# Patient Record
Sex: Male | Born: 1980 | Race: White | Hispanic: No | Marital: Single | Smoking: Former smoker
Health system: Southern US, Community
[De-identification: ages and names within clinical notes are randomized; demographics above are authoritative.]

## PROBLEM LIST (undated history)

## (undated) DIAGNOSIS — J189 Pneumonia, unspecified organism: Secondary | ICD-10-CM

## (undated) DIAGNOSIS — F1911 Other psychoactive substance abuse, in remission: Secondary | ICD-10-CM

## (undated) DIAGNOSIS — F419 Anxiety disorder, unspecified: Secondary | ICD-10-CM

---
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
## 2003-02-18 ENCOUNTER — Emergency Department (HOSPITAL_COMMUNITY): Admission: EM | Admit: 2003-02-18 | Discharge: 2003-02-18 | Payer: Self-pay | Admitting: Emergency Medicine

## 2003-03-29 ENCOUNTER — Emergency Department (HOSPITAL_COMMUNITY): Admission: EM | Admit: 2003-03-29 | Discharge: 2003-03-29 | Payer: Self-pay | Admitting: Emergency Medicine

## 2003-03-31 ENCOUNTER — Inpatient Hospital Stay (HOSPITAL_COMMUNITY): Admission: AD | Admit: 2003-03-31 | Discharge: 2003-04-03 | Payer: Self-pay | Admitting: Orthopedic Surgery

## 2003-10-11 ENCOUNTER — Emergency Department (HOSPITAL_COMMUNITY): Admission: EM | Admit: 2003-10-11 | Discharge: 2003-10-11 | Payer: Self-pay | Admitting: Emergency Medicine

## 2003-10-14 ENCOUNTER — Emergency Department (HOSPITAL_COMMUNITY): Admission: EM | Admit: 2003-10-14 | Discharge: 2003-10-14 | Payer: Self-pay | Admitting: Emergency Medicine

## 2003-10-24 ENCOUNTER — Emergency Department (HOSPITAL_COMMUNITY): Admission: EM | Admit: 2003-10-24 | Discharge: 2003-10-24 | Payer: Self-pay | Admitting: Emergency Medicine

## 2004-04-06 ENCOUNTER — Emergency Department (HOSPITAL_COMMUNITY): Admission: EM | Admit: 2004-04-06 | Discharge: 2004-04-06 | Payer: Self-pay | Admitting: Emergency Medicine

## 2004-05-05 ENCOUNTER — Emergency Department (HOSPITAL_COMMUNITY): Admission: EM | Admit: 2004-05-05 | Discharge: 2004-05-05 | Payer: Self-pay | Admitting: Emergency Medicine

## 2008-04-03 ENCOUNTER — Emergency Department (HOSPITAL_COMMUNITY): Admission: EM | Admit: 2008-04-03 | Discharge: 2008-04-03 | Payer: Self-pay | Admitting: Emergency Medicine

## 2008-06-08 ENCOUNTER — Emergency Department (HOSPITAL_COMMUNITY): Admission: EM | Admit: 2008-06-08 | Discharge: 2008-06-08 | Payer: Self-pay | Admitting: *Deleted

## 2010-10-27 NOTE — Discharge Summary (Signed)
NAME:  Stanley Williams, KEELAN                       ACCOUNT NO.:  [id number]   MEDICAL RECORD NO.:  [id number]                   PATIENT TYPE:  INP   LOCATION:  5705                                 FACILITY:  [facility]   PHYSICIAN:  Artist Pais. Mina Marble, M.D.           DATE OF BIRTH:  09-30-1980   DATE OF ADMISSION:  03/31/2003  DATE OF DISCHARGE:  04/03/2003                                 DISCHARGE SUMMARY   ADMISSION DIAGNOSES:  1. Left upper extremity cellulitis.  2. Abscess, left hand and forearm.  3. 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     On current methadone treatment.  4. Tobacco abuse.   DISCHARGE DIAGNOSES:  1. Left upper extremity cellulitis.  2. Abscess, left hand and forearm.  3. 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     On current methadone treatment.  4. Tobacco abuse.   OPERATION/PROCEDURES:  None.   CONSULTATIONS:  Infectious disease.   BRIEF HISTORY:  Patient is a 30 year old white male who sustained pain,  swelling, and redness, status post IV drug injection in the dorsum of his  left hand.  He had increasing pain, swelling, tenderness.  Seen in [facility] room on Monday and was then seen in our office on Tuesday.  He was placed on Augmentin 875 mg b.i.d.  He had worsening symptoms, and it  was felt best to place him in the hospital for IV antibiotics and infectious  disease consultation.   HOSPITAL COURSE:  Patient was admitted on March 31, 2003.  He was placed  on IV Unasyn.  Infectious disease was consulted.  Placed a K pad with moist  heat.  An MRI was obtained to rule out an abscess of the left upper  extremity.  He was placed on pain medication.  Infectious disease felt that  it was best to treat him with Unasyn empirically for his associated  cellulitis, lymphangitis, and thrombophlebitis.   The first day post IV antibiotics, he was improving.  He had decreased  redness,  decreased swelling, decreased pain.  An MRI was obtained.  There  was no discrete abscess or active purulence.   He continued to improved.  He remained afebrile throughout his  hospitalization.  He was ready for discharge home on April 03, 2003 with  the recommendation of p.o. Augmentin, per infectious disease, 875 mg b.i.d.  for two more weeks.  He was then to follow up in our office the following  Tuesday.   MRI report:  As stated above.   WBC was 9.5, hemoglobin 12.2, hematocrit 36.3.  PT 13, PTT 33.  Basic  metabolic panel was within normal limits.  Underwent hepatitis and HIV  testing, which were all negative.  Urinalysis was negative.   DISCHARGE CONDITION:  Stable and improved.   DISCHARGE INSTRUCTIONS:  1. Discharge home.  2. Augmentin 875 mg p.o. b.i.d. x2 weeks.  3. Follow up on Tuesday at Dr. Debbe Bales office.  Contact our     office prior to followup if he has any questions or concerns, worsening     redness, cellulitis, increased pain, or fever greater than 102.      Aura Fey Bobbe Medico.                       Artist Pais Mina Marble, M.D.    SCI/MEDQ  D:  04/21/2003  T:  04/22/2003  Job:  [id number]

## 2011-03-12 LAB — RPR: RPR Ser Ql: NONREACTIVE

## 2014-03-08 ENCOUNTER — Inpatient Hospital Stay (HOSPITAL_COMMUNITY)
Admission: EM | Admit: 2014-03-08 | Discharge: 2014-03-13 | DRG: 177 | Disposition: A | Discharge status: Home / Self Care | Payer: No Typology Code available for payment source | Source: Other Acute Inpatient Hospital | Attending: Surgery | Admitting: Surgery

## 2014-03-08 ENCOUNTER — Encounter (HOSPITAL_COMMUNITY)
Admission: EM | Disposition: A | Discharge status: Home / Self Care | Payer: Self-pay | Source: Other Acute Inpatient Hospital | Attending: Surgery

## 2014-03-08 ENCOUNTER — Inpatient Hospital Stay (HOSPITAL_COMMUNITY): Payer: No Typology Code available for payment source

## 2014-03-08 ENCOUNTER — Inpatient Hospital Stay (HOSPITAL_COMMUNITY): Payer: MEDICAID

## 2014-03-08 ENCOUNTER — Inpatient Hospital Stay (HOSPITAL_COMMUNITY): Payer: No Typology Code available for payment source | Admitting: Certified Registered Nurse Anesthetist

## 2014-03-08 ENCOUNTER — Encounter (HOSPITAL_COMMUNITY): Payer: MEDICAID | Admitting: Certified Registered Nurse Anesthetist

## 2014-03-08 ENCOUNTER — Encounter (HOSPITAL_COMMUNITY): Payer: Self-pay | Admitting: *Deleted

## 2014-03-08 DIAGNOSIS — F1910 Other psychoactive substance abuse, uncomplicated: Secondary | ICD-10-CM | POA: Diagnosis present

## 2014-03-08 DIAGNOSIS — J869 Pyothorax without fistula: Secondary | ICD-10-CM | POA: Diagnosis present

## 2014-03-08 DIAGNOSIS — Z87891 Personal history of nicotine dependence: Secondary | ICD-10-CM

## 2014-03-08 DIAGNOSIS — J189 Pneumonia, unspecified organism: Secondary | ICD-10-CM

## 2014-03-08 DIAGNOSIS — Z79891 Long term (current) use of opiate analgesic: Secondary | ICD-10-CM

## 2014-03-08 DIAGNOSIS — F419 Anxiety disorder, unspecified: Secondary | ICD-10-CM | POA: Diagnosis present

## 2014-03-08 HISTORY — PX: DECORTICATION: SHX5101

## 2014-03-08 HISTORY — DX: Pneumonia, unspecified organism: J18.9

## 2014-03-08 HISTORY — DX: Other psychoactive substance abuse, in remission: F19.11

## 2014-03-08 HISTORY — DX: Anxiety disorder, unspecified: F41.9

## 2014-03-08 HISTORY — PX: THORACOTOMY/LOBECTOMY: SHX6116

## 2014-03-08 LAB — COMPREHENSIVE METABOLIC PANEL
ALBUMIN: 2.9 g/dL — AB (ref 3.5–5.2)
ALK PHOS: 88 U/L (ref 39–117)
ALT: 23 U/L (ref 0–53)
AST: 24 U/L (ref 0–37)
Anion gap: 18 — ABNORMAL HIGH (ref 5–15)
BILIRUBIN TOTAL: 0.4 mg/dL (ref 0.3–1.2)
BUN: 11 mg/dL (ref 6–23)
CHLORIDE: 91 meq/L — AB (ref 96–112)
CO2: 23 mEq/L (ref 19–32)
Calcium: 9.3 mg/dL (ref 8.4–10.5)
Creatinine, Ser: 0.97 mg/dL (ref 0.50–1.35)
GFR calc Af Amer: >90 mL/min (ref >90)
Glucose, Bld: 116 mg/dL — ABNORMAL HIGH (ref 70–99)
POTASSIUM: 4.5 meq/L (ref 3.7–5.3)
Sodium: 132 mEq/L — ABNORMAL LOW (ref 137–147)
Total Protein: 8.3 g/dL (ref 6.0–8.3)

## 2014-03-08 LAB — CBC
HEMATOCRIT: 37.4 % — AB (ref 39.0–52.0)
Hemoglobin: 12.7 g/dL — ABNORMAL LOW (ref 13.0–17.0)
MCH: 29 pg (ref 26.0–34.0)
MCHC: 34 g/dL (ref 30.0–36.0)
MCV: 85.4 fL (ref 78.0–100.0)
PLATELETS: 591 10*3/uL — AB (ref 150–400)
RBC: 4.38 MIL/uL (ref 4.22–5.81)
RDW: 13.6 % (ref 11.5–15.5)
WBC: 21.9 10*3/uL — AB (ref 4.0–10.5)

## 2014-03-08 LAB — MRSA PCR SCREENING: MRSA by PCR: NEGATIVE

## 2014-03-08 LAB — GLUCOSE, CAPILLARY
GLUCOSE-CAPILLARY: 155 mg/dL — AB (ref 70–99)
Glucose-Capillary: 130 mg/dL — ABNORMAL HIGH (ref 70–99)

## 2014-03-08 SURGERY — LOBECTOMY, LUNG, OPEN
Anesthesia: General | Site: Chest | Laterality: Right

## 2014-03-08 MED ORDER — HYDROMORPHONE 0.3 MG/ML IV SOLN
INTRAVENOUS | Status: DC
  Administered 2014-03-08: 2.4 mg via INTRAVENOUS
  Administered 2014-03-08: 3.6 mg via INTRAVENOUS
  Administered 2014-03-08: 16:00:00 via INTRAVENOUS
  Administered 2014-03-09: 1.5 mg via INTRAVENOUS
  Administered 2014-03-09: 2.1 mg via INTRAVENOUS
  Administered 2014-03-09: 0.3 mg via INTRAVENOUS
  Administered 2014-03-09: 4.16 mg via INTRAVENOUS
  Administered 2014-03-09 (×2): via INTRAVENOUS
  Administered 2014-03-09: 1.8 mg via INTRAVENOUS
  Administered 2014-03-09: 4.8 mg via INTRAVENOUS
  Administered 2014-03-10: 2.1 mg via INTRAVENOUS
  Administered 2014-03-10: 2.4 mg via INTRAVENOUS
  Administered 2014-03-10: 3 mg via INTRAVENOUS
  Administered 2014-03-10: 4.5 mg via INTRAVENOUS
  Administered 2014-03-10: 2.4 mg via INTRAVENOUS
  Administered 2014-03-10: 11:00:00 via INTRAVENOUS
  Administered 2014-03-10: 2.4 mg via INTRAVENOUS
  Administered 2014-03-11: 4.2 mg via INTRAVENOUS
  Administered 2014-03-11: 3 mg via INTRAVENOUS
  Administered 2014-03-11: 2.4 mg via INTRAVENOUS
  Administered 2014-03-11: 01:00:00 via INTRAVENOUS
  Administered 2014-03-11: 1.5 mg via INTRAVENOUS
  Administered 2014-03-11 – 2014-03-12 (×2): 1.2 mg via INTRAVENOUS
  Administered 2014-03-12: 0.9 mg via INTRAVENOUS
  Administered 2014-03-12: 06:00:00 via INTRAVENOUS
  Administered 2014-03-12: 1.8 mg via INTRAVENOUS
  Filled 2014-03-08 (×7): qty 25

## 2014-03-08 MED ORDER — INSULIN ASPART 100 UNIT/ML ~~LOC~~ SOLN
0.0000 [IU] | SUBCUTANEOUS | Status: DC
  Administered 2014-03-08 – 2014-03-10 (×6): 2 [IU] via SUBCUTANEOUS

## 2014-03-08 MED ORDER — ROCURONIUM BROMIDE 100 MG/10ML IV SOLN
INTRAVENOUS | Status: DC | PRN
  Administered 2014-03-08: 50 mg via INTRAVENOUS

## 2014-03-08 MED ORDER — PNEUMOCOCCAL VAC POLYVALENT 25 MCG/0.5ML IJ INJ
0.5000 mL | INJECTION | INTRAMUSCULAR | Status: AC | PRN
  Administered 2014-03-13: 0.5 mL via INTRAMUSCULAR
  Filled 2014-03-08: qty 0.5

## 2014-03-08 MED ORDER — VANCOMYCIN HCL IN DEXTROSE 1-5 GM/200ML-% IV SOLN
1000.0000 mg | Freq: Two times a day (BID) | INTRAVENOUS | Status: DC
Start: 2014-03-09 — End: 2014-03-13
  Administered 2014-03-09 – 2014-03-13 (×9): 1000 mg via INTRAVENOUS
  Filled 2014-03-08 (×11): qty 200

## 2014-03-08 MED ORDER — SENNOSIDES-DOCUSATE SODIUM 8.6-50 MG PO TABS
1.0000 | ORAL_TABLET | Freq: Every day | ORAL | Status: DC
  Administered 2014-03-09 – 2014-03-11 (×3): 1 via ORAL
  Filled 2014-03-08 (×6): qty 1

## 2014-03-08 MED ORDER — PHENYLEPHRINE HCL 10 MG/ML IJ SOLN
10.0000 mg | INTRAVENOUS | Status: DC | PRN
  Administered 2014-03-08: 25 ug/min via INTRAVENOUS

## 2014-03-08 MED ORDER — HYDROMORPHONE 0.3 MG/ML IV SOLN
INTRAVENOUS | Status: AC
  Filled 2014-03-08: qty 25

## 2014-03-08 MED ORDER — PIPERACILLIN-TAZOBACTAM 3.375 G IVPB
3.3750 g | Freq: Three times a day (TID) | INTRAVENOUS | Status: DC
  Administered 2014-03-08 – 2014-03-12 (×12): 3.375 g via INTRAVENOUS
  Filled 2014-03-08 (×16): qty 50

## 2014-03-08 MED ORDER — PROPOFOL INFUSION 10 MG/ML OPTIME
INTRAVENOUS | Status: DC | PRN
  Administered 2014-03-08: 100 ug/kg/min via INTRAVENOUS

## 2014-03-08 MED ORDER — DEXTROSE-NACL 5-0.9 % IV SOLN
INTRAVENOUS | Status: DC
  Administered 2014-03-08 – 2014-03-10 (×3): via INTRAVENOUS

## 2014-03-08 MED ORDER — ONDANSETRON HCL 4 MG/2ML IJ SOLN
INTRAMUSCULAR | Status: DC | PRN
  Administered 2014-03-08: 4 mg via INTRAVENOUS

## 2014-03-08 MED ORDER — ACETAMINOPHEN 160 MG/5ML PO SOLN
1000.0000 mg | Freq: Four times a day (QID) | ORAL | Status: DC
  Filled 2014-03-08 (×20): qty 40

## 2014-03-08 MED ORDER — NALOXONE HCL 0.4 MG/ML IJ SOLN
0.4000 mg | INTRAMUSCULAR | Status: DC | PRN
Start: 2014-03-08 — End: 2014-03-12

## 2014-03-08 MED ORDER — DIPHENHYDRAMINE HCL 12.5 MG/5ML PO ELIX
12.5000 mg | ORAL_SOLUTION | Freq: Four times a day (QID) | ORAL | Status: DC | PRN
Start: 2014-03-08 — End: 2014-03-12
  Filled 2014-03-08: qty 5

## 2014-03-08 MED ORDER — ONDANSETRON HCL 4 MG/2ML IJ SOLN: 4.0000 mg | Freq: Four times a day (QID) | INTRAMUSCULAR | Status: DC | PRN

## 2014-03-08 MED ORDER — ENOXAPARIN SODIUM 40 MG/0.4ML ~~LOC~~ SOLN
40.0000 mg | Freq: Every day | SUBCUTANEOUS | Status: DC
  Administered 2014-03-09 – 2014-03-13 (×5): 40 mg via SUBCUTANEOUS
  Filled 2014-03-08 (×5): qty 0.4

## 2014-03-08 MED ORDER — FENTANYL CITRATE 0.05 MG/ML IJ SOLN
INTRAMUSCULAR | Status: AC
  Filled 2014-03-08: qty 5

## 2014-03-08 MED ORDER — METHADONE HCL 10 MG PO TABS: 180.0000 mg | ORAL_TABLET | ORAL | Status: DC

## 2014-03-08 MED ORDER — LIDOCAINE HCL (CARDIAC) 20 MG/ML IV SOLN
INTRAVENOUS | Status: AC
  Filled 2014-03-08: qty 5

## 2014-03-08 MED ORDER — LACTATED RINGERS IV SOLN: INTRAVENOUS | Status: DC

## 2014-03-08 MED ORDER — DIPHENHYDRAMINE HCL 50 MG/ML IJ SOLN
12.5000 mg | Freq: Four times a day (QID) | INTRAMUSCULAR | Status: DC | PRN
  Administered 2014-03-09 – 2014-03-12 (×3): 12.5 mg via INTRAVENOUS
  Filled 2014-03-08 (×3): qty 1

## 2014-03-08 MED ORDER — LACTATED RINGERS IV SOLN
INTRAVENOUS | Status: DC | PRN
  Administered 2014-03-08: 13:00:00 via INTRAVENOUS

## 2014-03-08 MED ORDER — FENTANYL CITRATE 0.05 MG/ML IJ SOLN
INTRAMUSCULAR | Status: AC
  Filled 2014-03-08: qty 2

## 2014-03-08 MED ORDER — FENTANYL CITRATE 0.05 MG/ML IJ SOLN
25.0000 ug | INTRAMUSCULAR | Status: DC | PRN
  Administered 2014-03-08 (×2): 50 ug via INTRAVENOUS

## 2014-03-08 MED ORDER — LEVALBUTEROL HCL 0.63 MG/3ML IN NEBU
0.6300 mg | INHALATION_SOLUTION | Freq: Four times a day (QID) | RESPIRATORY_TRACT | Status: DC
  Administered 2014-03-08 – 2014-03-10 (×8): 0.63 mg via RESPIRATORY_TRACT
  Filled 2014-03-08 (×16): qty 3

## 2014-03-08 MED ORDER — PROPOFOL 10 MG/ML IV BOLUS
INTRAVENOUS | Status: DC | PRN
  Administered 2014-03-08: 200 mg via INTRAVENOUS

## 2014-03-08 MED ORDER — OXYCODONE HCL 5 MG PO TABS
10.0000 mg | ORAL_TABLET | ORAL | Status: DC | PRN
  Administered 2014-03-09 – 2014-03-13 (×9): 10 mg via ORAL
  Filled 2014-03-08 (×9): qty 2

## 2014-03-08 MED ORDER — ONDANSETRON HCL 4 MG/2ML IJ SOLN
INTRAMUSCULAR | Status: AC
  Filled 2014-03-08: qty 2

## 2014-03-08 MED ORDER — PROPOFOL 10 MG/ML IV BOLUS
INTRAVENOUS | Status: AC
  Filled 2014-03-08: qty 20

## 2014-03-08 MED ORDER — 0.9 % SODIUM CHLORIDE (POUR BTL) OPTIME
TOPICAL | Status: DC | PRN
  Administered 2014-03-08 (×2): 1000 mL

## 2014-03-08 MED ORDER — MIDAZOLAM HCL 5 MG/5ML IJ SOLN
INTRAMUSCULAR | Status: DC | PRN
  Administered 2014-03-08: 2 mg via INTRAVENOUS

## 2014-03-08 MED ORDER — ACETAMINOPHEN 500 MG PO TABS
1000.0000 mg | ORAL_TABLET | Freq: Four times a day (QID) | ORAL | Status: DC
  Administered 2014-03-08 – 2014-03-13 (×17): 1000 mg via ORAL
  Filled 2014-03-08 (×21): qty 2

## 2014-03-08 MED ORDER — LACTATED RINGERS IV SOLN
INTRAVENOUS | Status: DC | PRN
  Administered 2014-03-08 (×2): via INTRAVENOUS

## 2014-03-08 MED ORDER — VANCOMYCIN HCL IN DEXTROSE 1-5 GM/200ML-% IV SOLN
1000.0000 mg | Freq: Three times a day (TID) | INTRAVENOUS | Status: DC
  Administered 2014-03-08 (×2): 1000 mg via INTRAVENOUS
  Filled 2014-03-08 (×3): qty 200

## 2014-03-08 MED ORDER — NEOSTIGMINE METHYLSULFATE 10 MG/10ML IV SOLN
INTRAVENOUS | Status: DC | PRN
  Administered 2014-03-08: 4 mg via INTRAVENOUS

## 2014-03-08 MED ORDER — SODIUM CHLORIDE 0.9 % IJ SOLN: 3.0000 mL | Freq: Two times a day (BID) | INTRAMUSCULAR | Status: DC

## 2014-03-08 MED ORDER — PIPERACILLIN-TAZOBACTAM 3.375 G IVPB
3.3750 g | Freq: Three times a day (TID) | INTRAVENOUS | Status: DC
  Administered 2014-03-08: 3.375 g via INTRAVENOUS
  Filled 2014-03-08 (×3): qty 50

## 2014-03-08 MED ORDER — ARTIFICIAL TEARS OP OINT
TOPICAL_OINTMENT | OPHTHALMIC | Status: AC
  Filled 2014-03-08: qty 3.5

## 2014-03-08 MED ORDER — GLYCOPYRROLATE 0.2 MG/ML IJ SOLN
INTRAMUSCULAR | Status: DC | PRN
  Administered 2014-03-08: .6 mg via INTRAVENOUS

## 2014-03-08 MED ORDER — LIDOCAINE HCL (CARDIAC) 20 MG/ML IV SOLN
INTRAVENOUS | Status: DC | PRN
  Administered 2014-03-08: 20 mg via INTRAVENOUS

## 2014-03-08 MED ORDER — METHADONE HCL 5 MG PO TABS: 180.0000 mg | ORAL_TABLET | Freq: Every day | ORAL | Status: DC

## 2014-03-08 MED ORDER — METHADONE HCL 10 MG PO TABS
180.0000 mg | ORAL_TABLET | Freq: Once | ORAL | Status: DC
  Administered 2014-03-08: 180 mg via ORAL
  Filled 2014-03-08: qty 36
  Filled 2014-03-08: qty 18

## 2014-03-08 MED ORDER — MIDAZOLAM HCL 2 MG/2ML IJ SOLN
INTRAMUSCULAR | Status: AC
  Filled 2014-03-08: qty 2

## 2014-03-08 MED ORDER — FENTANYL CITRATE 0.05 MG/ML IJ SOLN
100.0000 ug | Freq: Once | INTRAMUSCULAR | Status: AC
  Administered 2014-03-08: 100 ug via INTRAVENOUS

## 2014-03-08 MED ORDER — INFLUENZA VAC SPLIT QUAD 0.5 ML IM SUSY: 0.5000 mL | PREFILLED_SYRINGE | INTRAMUSCULAR | Status: DC | PRN

## 2014-03-08 MED ORDER — SODIUM CHLORIDE 0.9 % IJ SOLN: 9.0000 mL | INTRAMUSCULAR | Status: DC | PRN

## 2014-03-08 MED ORDER — VANCOMYCIN HCL IN DEXTROSE 1-5 GM/200ML-% IV SOLN: 1000.0000 mg | Freq: Two times a day (BID) | INTRAVENOUS | Status: DC

## 2014-03-08 MED ORDER — MIDAZOLAM HCL 2 MG/2ML IJ SOLN: 2.0000 mg | Freq: Once | INTRAMUSCULAR | Status: DC

## 2014-03-08 MED ORDER — BISACODYL 5 MG PO TBEC
10.0000 mg | DELAYED_RELEASE_TABLET | Freq: Every day | ORAL | Status: DC
  Administered 2014-03-08 – 2014-03-11 (×4): 10 mg via ORAL
  Filled 2014-03-08 (×4): qty 2

## 2014-03-08 MED ORDER — FENTANYL CITRATE 0.05 MG/ML IJ SOLN
INTRAMUSCULAR | Status: DC | PRN
  Administered 2014-03-08: 50 ug via INTRAVENOUS
  Administered 2014-03-08: 250 ug via INTRAVENOUS
  Administered 2014-03-08: 50 ug via INTRAVENOUS

## 2014-03-08 MED ORDER — DEXTROSE-NACL 5-0.9 % IV SOLN
INTRAVENOUS | Status: DC
  Administered 2014-03-08: 11:00:00 via INTRAVENOUS

## 2014-03-08 MED ORDER — ROCURONIUM BROMIDE 50 MG/5ML IV SOLN
INTRAVENOUS | Status: AC
Start: 2014-03-08 — End: 2014-03-08
  Filled 2014-03-08: qty 1

## 2014-03-08 MED ORDER — METHADONE HCL 10 MG/ML PO CONC: 180.0000 mg | Freq: Every day | ORAL | Status: DC

## 2014-03-08 MED ORDER — MIDAZOLAM HCL 2 MG/2ML IJ SOLN
INTRAMUSCULAR | Status: AC
  Administered 2014-03-08: 2 mg
  Filled 2014-03-08: qty 2

## 2014-03-08 MED ORDER — POTASSIUM CHLORIDE 10 MEQ/50ML IV SOLN
10.0000 meq | Freq: Every day | INTRAVENOUS | Status: DC | PRN
  Filled 2014-03-08: qty 50

## 2014-03-08 MED ORDER — ONDANSETRON HCL 4 MG/2ML IJ SOLN
4.0000 mg | Freq: Four times a day (QID) | INTRAMUSCULAR | Status: DC | PRN
Start: 2014-03-08 — End: 2014-03-08

## 2014-03-08 SURGICAL SUPPLY — 66 items
ADH SKN CLS APL DERMABOND .7 (GAUZE/BANDAGES/DRESSINGS) ×2
BLADE SURG 11 STRL SS (BLADE) IMPLANT
CANISTER SUCTION 2500CC (MISCELLANEOUS) ×4 IMPLANT
CATH KIT ON Q 5IN SLV (PAIN MANAGEMENT) IMPLANT
CATH THORACIC 28FR (CATHETERS) IMPLANT
CATH THORACIC 36FR (CATHETERS) IMPLANT
CATH THORACIC 36FR RT ANG (CATHETERS) IMPLANT
CLIP TI MEDIUM 24 (CLIP) ×2 IMPLANT
CONN ST 1/4X3/8  BEN (MISCELLANEOUS) ×3
CONN ST 1/4X3/8 BEN (MISCELLANEOUS) IMPLANT
CONN Y 3/8X3/8X3/8  BEN (MISCELLANEOUS) ×2
CONN Y 3/8X3/8X3/8 BEN (MISCELLANEOUS) IMPLANT
CONT SPEC 4OZ CLIKSEAL STRL BL (MISCELLANEOUS) ×4 IMPLANT
COVER SURGICAL LIGHT HANDLE (MISCELLANEOUS) ×4 IMPLANT
DERMABOND ADVANCED (GAUZE/BANDAGES/DRESSINGS) ×2
DERMABOND ADVANCED .7 DNX12 (GAUZE/BANDAGES/DRESSINGS) IMPLANT
DRAIN CHANNEL 32F RND 10.7 FF (WOUND CARE) ×3 IMPLANT
DRAPE LAPAROSCOPIC ABDOMINAL (DRAPES) ×2 IMPLANT
DRAPE WARM FLUID 44X44 (DRAPE) ×2 IMPLANT
ELECT REM PT RETURN 9FT ADLT (ELECTROSURGICAL) ×2
ELECTRODE REM PT RTRN 9FT ADLT (ELECTROSURGICAL) ×1 IMPLANT
GAUZE SPONGE 4X4 12PLY STRL (GAUZE/BANDAGES/DRESSINGS) ×2 IMPLANT
GLOVE EUDERMIC 7 POWDERFREE (GLOVE) ×4 IMPLANT
GOWN STRL REUS W/ TWL LRG LVL3 (GOWN DISPOSABLE) ×2 IMPLANT
GOWN STRL REUS W/ TWL XL LVL3 (GOWN DISPOSABLE) ×1 IMPLANT
GOWN STRL REUS W/TWL LRG LVL3 (GOWN DISPOSABLE) ×2
GOWN STRL REUS W/TWL XL LVL3 (GOWN DISPOSABLE) ×8
KIT BASIN OR (CUSTOM PROCEDURE TRAY) ×2 IMPLANT
KIT ROOM TURNOVER OR (KITS) ×2 IMPLANT
NS IRRIG 1000ML POUR BTL (IV SOLUTION) ×7 IMPLANT
PACK CHEST (CUSTOM PROCEDURE TRAY) ×2 IMPLANT
PAD ARMBOARD 7.5X6 YLW CONV (MISCELLANEOUS) ×4 IMPLANT
SEALANT SURG COSEAL 4ML (VASCULAR PRODUCTS) IMPLANT
SEALANT SURG COSEAL 8ML (VASCULAR PRODUCTS) IMPLANT
SOLUTION ANTI FOG 6CC (MISCELLANEOUS) ×2 IMPLANT
SPECIMEN JAR MEDIUM (MISCELLANEOUS) ×3 IMPLANT
SPONGE GAUZE 4X4 12PLY STER LF (GAUZE/BANDAGES/DRESSINGS) ×1 IMPLANT
SUT PROLENE 3 0 SH DA (SUTURE) IMPLANT
SUT PROLENE 4 0 RB 1 (SUTURE)
SUT PROLENE 4-0 RB1 .5 CRCL 36 (SUTURE) IMPLANT
SUT SILK  1 MH (SUTURE) ×3
SUT SILK 1 MH (SUTURE) ×2 IMPLANT
SUT SILK 2 0SH CR/8 30 (SUTURE) ×1 IMPLANT
SUT SILK 3 0 SH CR/8 (SUTURE) IMPLANT
SUT VIC AB 1 CTX 36 (SUTURE) ×4
SUT VIC AB 1 CTX36XBRD ANBCTR (SUTURE) ×2 IMPLANT
SUT VIC AB 2 TP1 27 (SUTURE) ×3 IMPLANT
SUT VIC AB 2-0 CT1 27 (SUTURE)
SUT VIC AB 2-0 CT1 TAPERPNT 27 (SUTURE) IMPLANT
SUT VIC AB 2-0 CTX 36 (SUTURE) ×4 IMPLANT
SUT VIC AB 2-0 UR6 27 (SUTURE) IMPLANT
SUT VIC AB 3-0 MH 27 (SUTURE) IMPLANT
SUT VIC AB 3-0 SH 27 (SUTURE)
SUT VIC AB 3-0 SH 27X BRD (SUTURE) IMPLANT
SUT VIC AB 3-0 X1 27 (SUTURE) ×3 IMPLANT
SWAB COLLECTION DEVICE MRSA (MISCELLANEOUS) ×1 IMPLANT
SYSTEM SAHARA CHEST DRAIN ATS (WOUND CARE) ×2 IMPLANT
TAPE CLOTH SURG 4X10 WHT LF (GAUZE/BANDAGES/DRESSINGS) ×1 IMPLANT
TAPE UMBILICAL 1/8 X36 TWILL (MISCELLANEOUS) IMPLANT
TIP APPLICATOR SPRAY EXTEND 16 (VASCULAR PRODUCTS) IMPLANT
TOWEL OR 17X24 6PK STRL BLUE (TOWEL DISPOSABLE) ×2 IMPLANT
TOWEL OR 17X26 10 PK STRL BLUE (TOWEL DISPOSABLE) ×2 IMPLANT
TRAP SPECIMEN MUCOUS 40CC (MISCELLANEOUS) IMPLANT
TRAY FOLEY CATH 16FRSI W/METER (SET/KITS/TRAYS/PACK) ×2 IMPLANT
TUBE ANAEROBIC SPECIMEN COL (MISCELLANEOUS) ×1 IMPLANT
WATER STERILE IRR 1000ML POUR (IV SOLUTION) ×4 IMPLANT

## 2014-03-08 NOTE — H&P (Signed)
[address]       Stanley Williams [address]             [phone]      Cardiothoracic Surgery History and Physical   Stanley Williams is an 33 y.o. male.   Chief Complaint:  Right Empyema HPI:   The patient is a 33 year old gentleman with a long history of IVDA on daily Methadone, smoking until recently, who presented to [facility] ER about a week ago with shortness of breath and cough. He was diagnosed with pneumonia and treated with amoxicillin and another antibiotic. He says he took all of the antibiotic but did not get better and went back to [facility] ER last pm with worsening shortness of breath, cough, fever, right chest pain. Chest CT showed a large right empyema. I was called by the [facility] ER physician last pm and planned to transfer the patient last night but there were no ICU or stepdown beds so he was transferred this am.  Past Medical History  Diagnosis Date  . Anxiety   . 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 PNA (pneumonia) 03/08/2014    Past Surgical History  Procedure Laterality Date  . Ankle Left 1997  Infection in left hand and forearm due to IVDA requiring drainage 03/2003  History reviewed. No pertinent family history. Social History:  reports that he has quit smoking. His smoking use included E-cigarettes. He smoked 0.00 packs per day. He has never used smokeless tobacco. He reports that he used IV drugs in the past but has been on Methadone for 13 years. He denies current IVDA.  He reports that he does not drink alcohol.  Allergies:  Allergies  Allergen Reactions  . Toradol [Ketorolac Tromethamine] Nausea Only  . Ultram [Tramadol] Nausea Only    Medications Prior to Admission  Medication Sig Dispense Refill  . methadone (DOLOPHINE) 10 MG/ML solution Take 180 mg by mouth daily.        No results found for this or any previous visit (from the past 48 hour(s)). No results found.  Review of Systems  Constitutional:  Positive for fever, chills, malaise/fatigue and diaphoresis. Negative for weight loss.  HENT: Negative.   Eyes: Negative.   Respiratory: Positive for cough, sputum production and shortness of breath. Negative for hemoptysis.   Cardiovascular: Positive for chest pain and orthopnea. Negative for palpitations, leg swelling and PND.  Gastrointestinal: Negative.   Genitourinary: Negative.   Musculoskeletal: Negative.   Skin: Negative.   Neurological: Negative.   Endo/Heme/Allergies: Negative.   Psychiatric/Behavioral: Positive for substance abuse. The patient is nervous/anxious.     Blood pressure 128/69, pulse 107, temperature 98 F (36.7 C), temperature source Oral, resp. rate 23, height  (1.854 m), weight 84.1 kg (185 lb 6.5 oz), SpO2 95.00%. Physical Exam  Constitutional: He is oriented to person, place, and time. He appears well-developed and well-nourished.  Looks uncomfortable but in no distress.  HENT:  Head: Normocephalic and atraumatic.  Mouth/Throat: Oropharynx is clear and moist.  Eyes: EOM are normal. Pupils are equal, round, and reactive to light.  Large pupils bilat  Neck: Normal range of motion. Neck supple. No JVD present. No tracheal deviation present. No thyromegaly present.  Cardiovascular: Normal rate, regular rhythm, normal heart sounds and intact distal pulses.   No murmur heard. Respiratory: He exhibits no tenderness.  Decreased breath sounds on the right with rhonchi.  GI: Soft. Bowel  sounds are normal. He exhibits no distension and no mass. There is no tenderness.  Musculoskeletal: He exhibits no edema.  Lymphadenopathy:    He has no cervical adenopathy.  Neurological: He is alert and oriented to person, place, and time. He has normal strength. No cranial nerve deficit or sensory deficit.  Skin: Skin is warm and dry.  Multiple tattoos on torso and upper extremities.  Psychiatric: He has a normal mood and affect.     Assessment/Plan  His CT from  [facility] was reviewed with radiology. There is a large complex right empyema that will require drainage and decortication of the lung. Plan to do that this afternoon. I discussed the procedure of right thoracotomy, drainage of empyema and decortication of the lung with the patient including alternatives, benefits, and risks including but not limited to bleeding, infection, prolonged air leak, respiratory failure, and he understands and agrees to proceed.  Marchia Diguglielmo K 03/08/2014, 10:13 AM

## 2014-03-08 NOTE — Brief Op Note (Signed)
03/08/2014  3:00 PM  PATIENT:  Stanley Williams  33 y.o. male  PRE-OPERATIVE DIAGNOSIS:  Right Empyema  POST-OPERATIVE DIAGNOSIS:  Right Empyema  PROCEDURE:  Procedure(s): THORACOTOMY  (Right) DECORTICATION, DRAINAGE OF EMPYEMA (Right)  SURGEON:  Surgeon(s) and Role:    * Alleen Borne, MD - Primary  PHYSICIAN ASSISTANT: Coral Ceo, PA-C    ANESTHESIA:   general  EBL:  Total I/O In: 1200 [I.V.:1000; IV Piggyback:200] Out: 340 [Urine:340]  BLOOD ADMINISTERED:none  DRAINS: (3 53F) Blake drain(s) in the right pleural space   LOCAL MEDICATIONS USED:  NONE  SPECIMEN:  Source of Specimen:  culture of right empyema  DISPOSITION OF SPECIMEN:  micro  COUNTS:  YES  TOURNIQUET:  * No tourniquets in log *  DICTATION: .Note written in EPIC  PLAN OF CARE: Admit to inpatient   PATIENT DISPOSITION:  PACU - hemodynamically stable.   Delay start of Pharmacological VTE agent (>24hrs) due to surgical blood loss or risk of bleeding: no

## 2014-03-08 NOTE — Anesthesia Postprocedure Evaluation (Signed)
  Anesthesia Post-op Note  Patient: Stanley Williams  Procedure(s) Performed: Procedure(s): THORACOTOMY  (Right) DECORTICATION, DRAINAGE OF EMPYEMA (Right)  Patient Location: PACU  Anesthesia Type:General  Level of Consciousness: awake  Airway and Oxygen Therapy: Patient Spontanous Breathing  Post-op Pain: mild  Post-op Assessment: Post-op Vital signs reviewed  Post-op Vital Signs: Reviewed  Last Vitals:  Filed Vitals:   03/08/14 1535  BP: 138/99  Pulse:   Temp: 36.2 C  Resp: 23    Complications: No apparent anesthesia complications

## 2014-03-08 NOTE — Transfer of Care (Signed)
Immediate Anesthesia Transfer of Care Note  Patient: Stanley Williams  Procedure(s) Performed: Procedure(s): THORACOTOMY  (Right) DECORTICATION, DRAINAGE OF EMPYEMA (Right)  Patient Location: PACU  Anesthesia Type:General  Level of Consciousness: awake and alert   Airway & Oxygen Therapy: Patient Spontanous Breathing and Patient connected to face mask oxygen  Post-op Assessment: Report given to PACU RN, Post -op Vital signs reviewed and stable and Patient moving all extremities  Post vital signs: Reviewed and stable  Complications: No apparent anesthesia complications

## 2014-03-08 NOTE — Anesthesia Procedure Notes (Addendum)
Procedure Name: Intubation Date/Time: 03/08/2014 1:20 PM Performed by: Orvilla Fus A Pre-anesthesia Checklist: Patient identified, Timeout performed, Emergency Drugs available, Suction available and Patient being monitored Patient Re-evaluated:Patient Re-evaluated prior to inductionOxygen Delivery Method: Circle system utilized Preoxygenation: Pre-oxygenation with 100% oxygen Intubation Type: IV induction Ventilation: Mask ventilation without difficulty Laryngoscope Size: Mac and 3 Grade View: Grade I Tube type: Oral Endobronchial tube: Double lumen EBT and Left and 39 Fr Number of attempts: 1 Airway Equipment and Method: Rigid stylet Placement Confirmation: ETT inserted through vocal cords under direct vision,  breath sounds checked- equal and bilateral and positive ETCO2 Secured at: 29 cm Tube secured with: Tape Dental Injury: Teeth and Oropharynx as per pre-operative assessment    RIJ CVP Dual lumen: 1235-1250:The patient was identified and consent obtained.  TO was performed, and full barrier precautions were used.  The skin was anesthetized with lidocaine.  Once the vein was located with the 22 ga. needle using ultrasound guidance , the wire was inserted into the vein.  The wire location was confirmed with ultrasound.  The tissue was dilated and the catheter was carefully inserted, then sutured in place. A dressing was applied. The patient tolerated the procedure well.   CE

## 2014-03-08 NOTE — Anesthesia Preprocedure Evaluation (Signed)
Anesthesia Evaluation  Patient identified by MRN, date of birth, ID band Patient awake    Reviewed: Allergy & Precautions, H&P , NPO status , Patient's Chart, lab work & pertinent test results  Airway Mallampati: II      Dental   Pulmonary pneumonia -, former smoker,          Cardiovascular     Neuro/Psych    GI/Hepatic negative GI ROS, Neg liver ROS,   Endo/Other  negative endocrine ROS  Renal/GU negative Renal ROS     Musculoskeletal   Abdominal   Peds  Hematology   Anesthesia Other Findings   Reproductive/Obstetrics                           Anesthesia Physical Anesthesia Plan  ASA: III  Anesthesia Plan: General   Post-op Pain Management:    Induction: Intravenous  Airway Management Planned: Double Lumen EBT  Additional Equipment: CVP and Arterial line  Intra-op Plan:   Post-operative Plan: Possible Post-op intubation/ventilation  Informed Consent: I have reviewed the patients History and Physical, chart, labs and discussed the procedure including the risks, benefits and alternatives for the proposed anesthesia with the patient or authorized representative who has indicated his/her understanding and acceptance.   Dental advisory given  Plan Discussed with: CRNA and Anesthesiologist  Anesthesia Plan Comments:         Anesthesia Quick Evaluation

## 2014-03-08 NOTE — Progress Notes (Signed)
ANTIBIOTIC CONSULT NOTE - INITIAL  Pharmacy Consult for vancomycin and zosyn Indication:  R empyema  Allergies  Allergen Reactions  . Toradol [Ketorolac Tromethamine] Nausea Only  . Ultram [Tramadol] Nausea Only    Patient Measurements: Height:  (185.4 cm) Weight: 185 lb 6.5 oz (84.1 kg) IBW/kg (Calculated) : 79.9   Vital Signs: Temp: 98 F (36.7 C) (09/28 1610) Temp src: Oral (09/28 0937) BP: 128/69 mmHg (09/28 1000) Pulse Rate: 107 (09/28 1000) Intake/Output from previous day:   Intake/Output from this shift:    Labs: No results found for this basename: WBC, HGB, PLT, LABCREA, CREATININE,  in the last 72 hours CrCl is unknown because no creatinine reading has been taken. No results found for this basename: VANCOTROUGH, VANCOPEAK, VANCORANDOM, GENTTROUGH, GENTPEAK, GENTRANDOM, TOBRATROUGH, TOBRAPEAK, TOBRARND, AMIKACINPEAK, AMIKACINTROU, AMIKACIN,  in the last 72 hours   Microbiology: No results found for this or any previous visit (from the past 720 hour(s)).  Medical History: Past Medical History  Diagnosis Date  . Anxiety   . 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 PNA (pneumonia) 03/08/2014   Assessment: The patient is a 33 year old gentleman with a long history of IVDA on daily Methadone, smoking until recently, who presented to [facility] ER about a week ago with shortness of breath and cough. He was diagnosed with pneumonia and treated with amoxicillin and azithromycin. He says he took all of the antibiotic but did not get better and went back to [facility] ER last pm with worsening shortness of breath, cough, fever, right chest pain. Chest CT showed a large right empyema.  Transferred from [place] to [facility]. Pharmacy consulted to dose vancomycin and zosyn for R empyema.  Wt 84 kg, labs not yet drawn. On amoxicillin and azithromycin at home.  ? abx at [facility]?  Goal of Therapy:  Vancomycin trough level 15-20 mcg/ml  Plan:  - Zosyn 3.375 gm IV q8h,  infuse each dose over 4 hours - Vancomycin 1000 mg IV q8h -f/u renal fxn, wbc, temp, culture data -steady-state vanc trough as needed  Herby Abraham, Pharm.D. [phone] 03/08/2014 11:19 AM

## 2014-03-09 ENCOUNTER — Inpatient Hospital Stay (HOSPITAL_COMMUNITY): Payer: No Typology Code available for payment source

## 2014-03-09 LAB — BLOOD GAS, ARTERIAL
ACID-BASE EXCESS: 1 mmol/L (ref 0.0–2.0)
BICARBONATE: 25.5 meq/L — AB (ref 20.0–24.0)
Drawn by: 347621
O2 Content: 3 L/min
O2 SAT: 94.4 %
PCO2 ART: 43.6 mmHg (ref 35.0–45.0)
PO2 ART: 77.1 mmHg — AB (ref 80.0–100.0)
Patient temperature: 98.7
TCO2: 26.8 mmol/L (ref 0–100)
pH, Arterial: 7.384 (ref 7.350–7.450)

## 2014-03-09 LAB — GLUCOSE, CAPILLARY
GLUCOSE-CAPILLARY: 148 mg/dL — AB (ref 70–99)
GLUCOSE-CAPILLARY: 141 mg/dL — AB (ref 70–99)
GLUCOSE-CAPILLARY: 126 mg/dL — AB (ref 70–99)
GLUCOSE-CAPILLARY: 113 mg/dL — AB (ref 70–99)
Glucose-Capillary: 118 mg/dL — ABNORMAL HIGH (ref 70–99)
Glucose-Capillary: 114 mg/dL — ABNORMAL HIGH (ref 70–99)

## 2014-03-09 LAB — CBC
HEMATOCRIT: 34.8 % — AB (ref 39.0–52.0)
HEMOGLOBIN: 11.7 g/dL — AB (ref 13.0–17.0)
MCH: 29 pg (ref 26.0–34.0)
MCHC: 33.6 g/dL (ref 30.0–36.0)
MCV: 86.4 fL (ref 78.0–100.0)
Platelets: 593 10*3/uL — ABNORMAL HIGH (ref 150–400)
RBC: 4.03 MIL/uL — ABNORMAL LOW (ref 4.22–5.81)
RDW: 13.8 % (ref 11.5–15.5)
WBC: 16 10*3/uL — ABNORMAL HIGH (ref 4.0–10.5)

## 2014-03-09 LAB — BASIC METABOLIC PANEL
Anion gap: 12 (ref 5–15)
BUN: 9 mg/dL (ref 6–23)
CHLORIDE: 100 meq/L (ref 96–112)
CO2: 27 mEq/L (ref 19–32)
Calcium: 8.2 mg/dL — ABNORMAL LOW (ref 8.4–10.5)
Creatinine, Ser: 0.94 mg/dL (ref 0.50–1.35)
GFR calc Af Amer: >90 mL/min (ref >90)
GFR calc non Af Amer: >90 mL/min (ref >90)
GLUCOSE: 147 mg/dL — AB (ref 70–99)
POTASSIUM: 4.4 meq/L (ref 3.7–5.3)
SODIUM: 139 meq/L (ref 137–147)

## 2014-03-09 MED ORDER — PANTOPRAZOLE SODIUM 40 MG PO TBEC
40.0000 mg | DELAYED_RELEASE_TABLET | Freq: Every day | ORAL | Status: DC
  Administered 2014-03-09 – 2014-03-13 (×5): 40 mg via ORAL
  Filled 2014-03-09 (×5): qty 1

## 2014-03-09 MED ORDER — KETOROLAC TROMETHAMINE 15 MG/ML IJ SOLN
15.0000 mg | Freq: Four times a day (QID) | INTRAMUSCULAR | Status: DC | PRN
  Filled 2014-03-09: qty 1

## 2014-03-09 MED ORDER — METHADONE HCL 10 MG PO TABS
180.0000 mg | ORAL_TABLET | Freq: Once | ORAL | Status: AC
  Administered 2014-03-09: 180 mg via ORAL

## 2014-03-09 MED ORDER — METHADONE HCL 10 MG PO TABS
180.0000 mg | ORAL_TABLET | ORAL | Status: DC
  Administered 2014-03-10 – 2014-03-13 (×4): 180 mg via ORAL
  Filled 2014-03-09 (×5): qty 18

## 2014-03-09 MED ORDER — PROPOFOL 10 MG/ML IV BOLUS
INTRAVENOUS | Status: AC
  Filled 2014-03-09: qty 20

## 2014-03-09 NOTE — Progress Notes (Signed)
1 Day Post-Op Procedure(s) (LRB): THORACOTOMY  (Right) DECORTICATION, DRAINAGE OF EMPYEMA (Right) Subjective:  Complains of pain but has been up walking and out of bed  Objective: Vital signs in last 24 hours: Temp:  [97.2 F (36.2 C)-98.9 F (37.2 C)] 97.8 F (36.6 C) (09/29 0328) Pulse Rate:  [70-113] 76 (09/29 0620) Cardiac Rhythm:  [-] Normal sinus rhythm (09/28 2000) Resp:  [9-30] 18 (09/29 0620) BP: (114-147)/(53-99) 129/77 mmHg (09/29 0620) SpO2:  [90 %-100 %] 94 % (09/29 0620) Arterial Line BP: (116-174)/(62-88) 149/73 mmHg (09/29 0620) Weight:  [84.1 kg (185 lb 6.5 oz)] 84.1 kg (185 lb 6.5 oz) (09/28 0937)  Hemodynamic parameters for last 24 hours:    Intake/Output from previous day: 09/28 0701 - 09/29 0700 In: 5259.6 [P.O.:1020; I.V.:4039.6; IV Piggyback:200] Out: 3155 [Urine:2715; Chest Tube:440] Intake/Output this shift:    General appearance: alert and cooperative Heart: regular rate and rhythm, S1, S2 normal, no murmur, click, rub or gallop Lungs: diminished breath sounds RLL serosanguinous chest tube output, no air leak  Lab Results:  Recent Labs  03/08/14 1110 03/09/14 0508  WBC 21.9* 16.0*  HGB 12.7* 11.7*  HCT 37.4* 34.8*  PLT 591* 593*   BMET:  Recent Labs  03/08/14 1110 03/09/14 0508  NA 132* 139  K 4.5 4.4  CL 91* 100  CO2 23 27  GLUCOSE 116* 147*  BUN 11 9  CREATININE 0.97 0.94  CALCIUM 9.3 8.2*    PT/INR: No results found for this basename: LABPROT, INR,  in the last 72 hours ABG    Component Value Date/Time   PHART 7.384 03/09/2014 0500   HCO3 25.5* 03/09/2014 0500   TCO2 26.8 03/09/2014 0500   O2SAT 94.4 03/09/2014 0500   CBG (last 3)   Recent Labs  03/08/14 1945 03/08/14 2338 03/09/14 0326  GLUCAP 155* 130* 148*   CXR ok  Assessment/Plan: S/P Procedure(s) (LRB): THORACOTOMY  (Right) DECORTICATION, DRAINAGE OF EMPYEMA (Right) He is doing well Pain control is the biggest issue for him. Will add Toradol. He says  po Toradol has caused nausea in the past but he is willing to try IV Toradol. Continue dilaudid PCA and Methadone.  DC foley and A-line Mobilize and do IS Continue vanc and Zosyn pending cultures. Lovenox and SCD's for DVT prophylaxis   LOS: 1 day    BARTLE,BRYAN K 03/09/2014

## 2014-03-09 NOTE — Progress Notes (Signed)
UR completed.  Joseline Mccampbell, RN BSN [facility] CCM Trauma/Neuro ICU Case Manager [phone]  

## 2014-03-10 ENCOUNTER — Inpatient Hospital Stay (HOSPITAL_COMMUNITY): Payer: No Typology Code available for payment source

## 2014-03-10 ENCOUNTER — Encounter (HOSPITAL_COMMUNITY): Payer: Self-pay | Admitting: Surgery

## 2014-03-10 LAB — BASIC METABOLIC PANEL
ANION GAP: 11 (ref 5–15)
BUN: 7 mg/dL (ref 6–23)
CALCIUM: 8.3 mg/dL — AB (ref 8.4–10.5)
CO2: 30 mEq/L (ref 19–32)
Chloride: 99 mEq/L (ref 96–112)
Creatinine, Ser: 0.9 mg/dL (ref 0.50–1.35)
GLUCOSE: 135 mg/dL — AB (ref 70–99)
Potassium: 4 mEq/L (ref 3.7–5.3)
SODIUM: 140 meq/L (ref 137–147)

## 2014-03-10 LAB — GLUCOSE, CAPILLARY
Glucose-Capillary: 124 mg/dL — ABNORMAL HIGH (ref 70–99)
Glucose-Capillary: 97 mg/dL (ref 70–99)

## 2014-03-10 LAB — CBC
HCT: 31.8 % — ABNORMAL LOW (ref 39.0–52.0)
HEMOGLOBIN: 10.1 g/dL — AB (ref 13.0–17.0)
MCH: 28.3 pg (ref 26.0–34.0)
MCHC: 31.8 g/dL (ref 30.0–36.0)
MCV: 89.1 fL (ref 78.0–100.0)
Platelets: 590 10*3/uL — ABNORMAL HIGH (ref 150–400)
RBC: 3.57 MIL/uL — ABNORMAL LOW (ref 4.22–5.81)
RDW: 14.4 % (ref 11.5–15.5)
WBC: 7.5 10*3/uL (ref 4.0–10.5)

## 2014-03-10 NOTE — Progress Notes (Signed)
2 Days Post-Op Procedure(s) (LRB): THORACOTOMY  (Right) DECORTICATION, DRAINAGE OF EMPYEMA (Right) Subjective:  No complaints. Pain seems adequately controlled.  Objective: Vital signs in last 24 hours: Temp:  [97.3 F (36.3 C)-97.8 F (36.6 C)] 97.8 F (36.6 C) (09/30 0800) Pulse Rate:  [57-78] 63 (09/30 0800) Cardiac Rhythm:  [-] Normal sinus rhythm (09/30 0800) Resp:  [9-20] 19 (09/30 0832) BP: (84-117)/(58-77) 103/67 mmHg (09/30 0800) SpO2:  [93 %-99 %] 99 % (09/30 0832)  Hemodynamic parameters for last 24 hours:    Intake/Output from previous day: 09/29 0701 - 09/30 0700 In: 2320.9 [P.O.:320; I.V.:1300.9; IV Piggyback:700] Out: 1665 [Urine:1575; Chest Tube:90] Intake/Output this shift:    General appearance: alert and cooperative Heart: regular rate and rhythm, S1, S2 normal, no murmur, click, rub or gallop Lungs: diminished breath sounds RLL minimal serous chest tube drainage, no air leak  Lab Results:  Recent Labs  03/09/14 0508 03/10/14 0500  WBC 16.0* 7.5  HGB 11.7* 10.1*  HCT 34.8* 31.8*  PLT 593* 590*   BMET:  Recent Labs  03/09/14 0508 03/10/14 0500  NA 139 140  K 4.4 4.0  CL 100 99  CO2 27 30  GLUCOSE 147* 135*  BUN 9 7  CREATININE 0.94 0.90  CALCIUM 8.2* 8.3*    PT/INR: No results found for this basename: LABPROT, INR,  in the last 72 hours ABG    Component Value Date/Time   PHART 7.384 03/09/2014 0500   HCO3 25.5* 03/09/2014 0500   TCO2 26.8 03/09/2014 0500   O2SAT 94.4 03/09/2014 0500   CBG (last 3)   Recent Labs  03/09/14 2319 03/10/14 0331 03/10/14 0806  GLUCAP 114* 124* 97    Assessment/Plan: S/P Procedure(s) (LRB): THORACOTOMY  (Right) DECORTICATION, DRAINAGE OF EMPYEMA (Right)  He has been very stable. Remains afebrile and WBC back to normal. He still has some consolidation of the RLL and needs to work on IS hourly and ambulate.  Continue vanc and Zosyn pending final culture results.  Transfer to stepdown.  LOS:  2 days    Charnise Lovan K 03/10/2014

## 2014-03-11 ENCOUNTER — Inpatient Hospital Stay (HOSPITAL_COMMUNITY): Payer: No Typology Code available for payment source

## 2014-03-11 IMAGING — CR DG CHEST 1V PORT
1 series · 1 of 1 positions shown · non-contrast
Comparison: [DATE] and earlier.

CLINICAL DATA: 33-year-old male status post drainage of empyema.
Initial encounter.

EXAM:
PORTABLE CHEST - 1 VIEW

[AP]
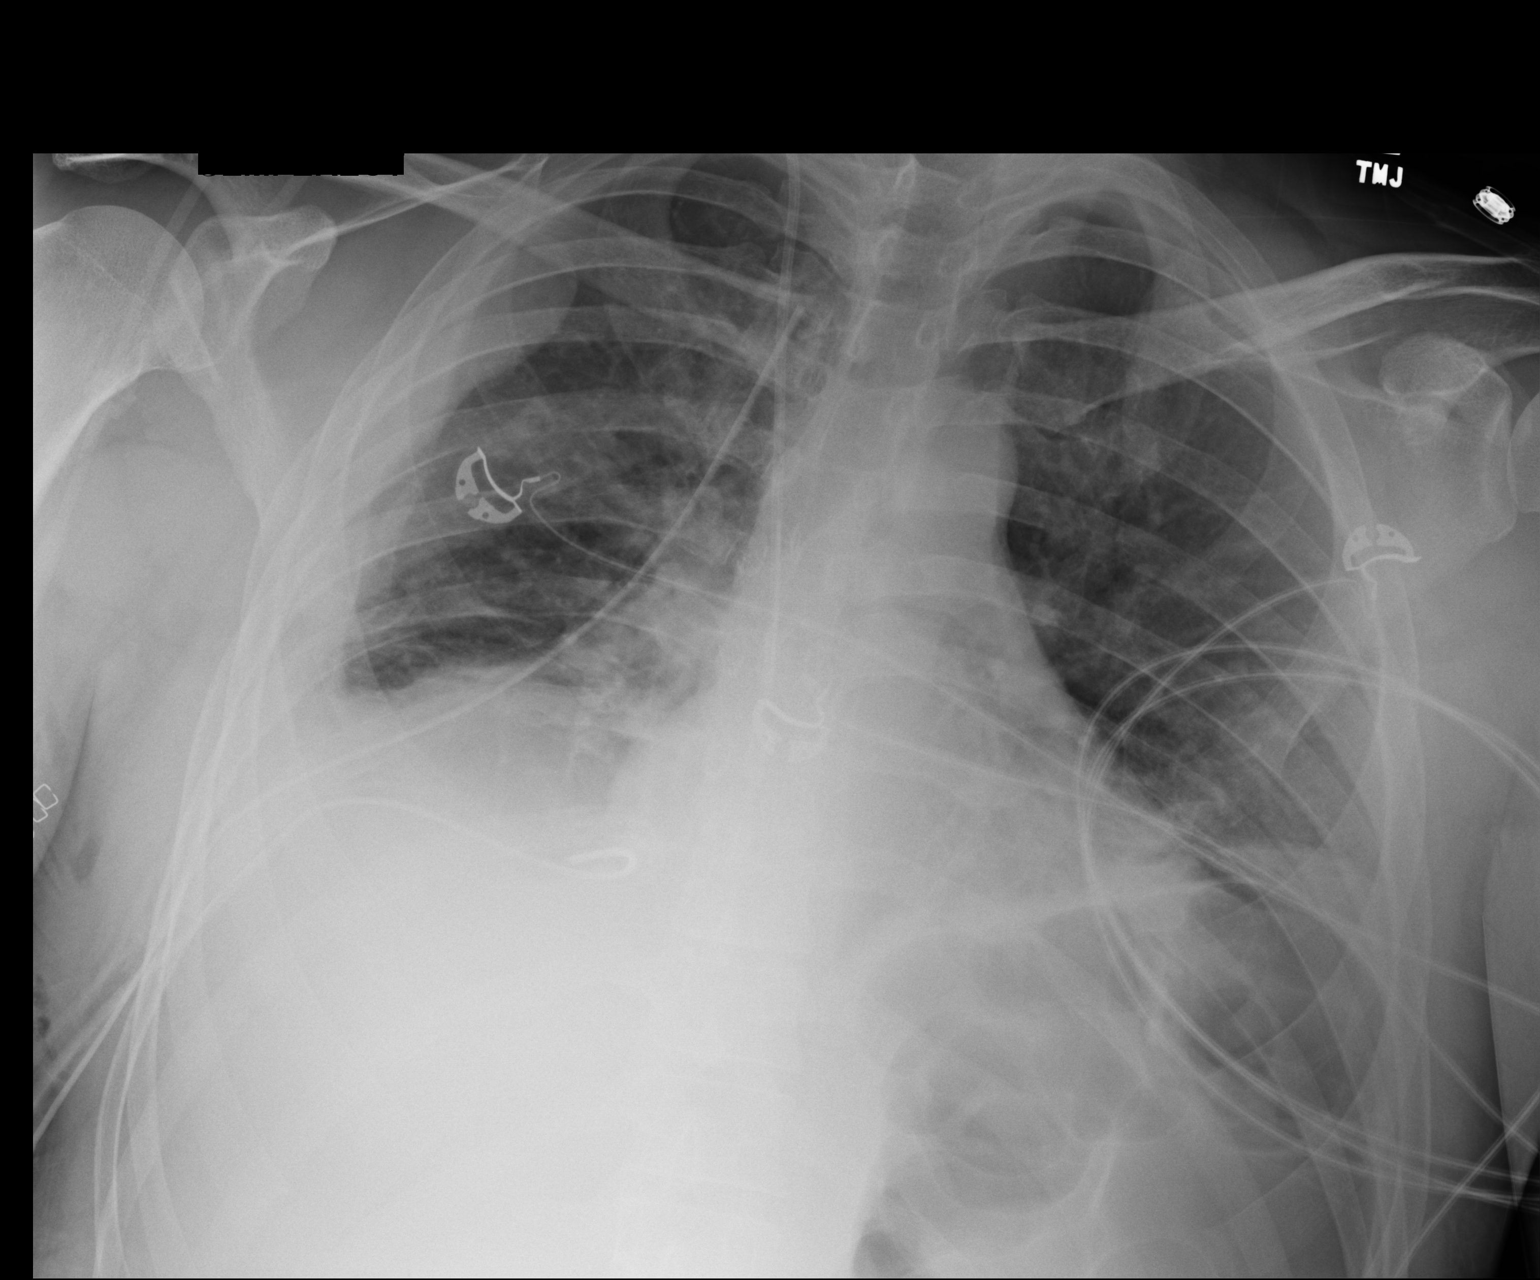

[1 of 1 positions shown; findings below may reference images not displayed]

FINDINGS: Portable AP semi upright view at [[facility]] hrs. One of the 3 right chest
tubes has been removed, 2 right chest tubes remains. Stable right IJ
central line. No pneumothorax. Residual at least partially loculated
right pleural fluid appears stable. Ventilation in the right lung is
stable. Left lung ventilation is mildly improved with less opacity
at the lung base. Stable cardiac size and mediastinal contours.
Visualized tracheal air column is within normal limits. Stable
gas-filled splenic flexure.
IMPRESSION: 1. One of 3 right chest tubes has been removed, two remain. No
pneumothorax.
2. Stable right lung ventilation with residual partially loculated
pleural fluid.
3. Mildly improved left lung ventilation, decreased atelectasis.

## 2014-03-11 MED ORDER — SODIUM CHLORIDE 0.9 % IJ SOLN
10.0000 mL | INTRAMUSCULAR | Status: DC | PRN
  Administered 2014-03-12 (×2): 10 mL

## 2014-03-11 MED ORDER — LEVALBUTEROL HCL 0.63 MG/3ML IN NEBU: 0.6300 mg | INHALATION_SOLUTION | Freq: Four times a day (QID) | RESPIRATORY_TRACT | Status: DC | PRN

## 2014-03-11 MED ORDER — LEVALBUTEROL HCL 0.63 MG/3ML IN NEBU
0.6300 mg | INHALATION_SOLUTION | Freq: Two times a day (BID) | RESPIRATORY_TRACT | Status: DC
  Administered 2014-03-11 – 2014-03-13 (×4): 0.63 mg via RESPIRATORY_TRACT
  Filled 2014-03-11 (×12): qty 3

## 2014-03-11 MED ORDER — SODIUM CHLORIDE 0.9 % IJ SOLN: 10.0000 mL | Freq: Two times a day (BID) | INTRAMUSCULAR | Status: DC

## 2014-03-11 MED ORDER — LACTULOSE 10 GM/15ML PO SOLN
10.0000 g | Freq: Once | ORAL | Status: AC
  Administered 2014-03-11: 10 g via ORAL
  Filled 2014-03-11: qty 15

## 2014-03-11 NOTE — Progress Notes (Signed)
3 Days Post-Op Procedure(s) (LRB): THORACOTOMY  (Right) DECORTICATION, DRAINAGE OF EMPYEMA (Right) Subjective: No complaints. Pain seems adequately controlled.  Objective: Vital signs in last 24 hours: Temp:  [97.7 F (36.5 C)-98.3 F (36.8 C)] 98.3 F (36.8 C) (09/30 2338) Pulse Rate:  [62-84] 68 (10/01 0700) Cardiac Rhythm:  [-] Normal sinus rhythm (10/01 0400) Resp:  [10-24] 18 (10/01 0700) BP: (109-127)/(66-79) 121/78 mmHg (10/01 0600) SpO2:  [92 %-99 %] 93 % (10/01 0741)  Hemodynamic parameters for last 24 hours:    Intake/Output from previous day: 09/30 0701 - 10/01 0700 In: 974 [I.V.:374; IV Piggyback:600] Out: 1580 [Urine:1500; Chest Tube:80] Intake/Output this shift:    General appearance: alert and cooperative Heart: regular rate and rhythm, S1, S2 normal, no murmur, click, rub or gallop Lungs: diminished breath sounds RLL minimal chest tube output. serous.  Lab Results:  Recent Labs  03/09/14 0508 03/10/14 0500  WBC 16.0* 7.5  HGB 11.7* 10.1*  HCT 34.8* 31.8*  PLT 593* 590*   BMET:  Recent Labs  03/09/14 0508 03/10/14 0500  NA 139 140  K 4.4 4.0  CL 100 99  CO2 27 30  GLUCOSE 147* 135*  BUN 9 7  CREATININE 0.94 0.90  CALCIUM 8.2* 8.3*    PT/INR: No results found for this basename: LABPROT, INR,  in the last 72 hours ABG    Component Value Date/Time   PHART 7.384 03/09/2014 0500   HCO3 25.5* 03/09/2014 0500   TCO2 26.8 03/09/2014 0500   O2SAT 94.4 03/09/2014 0500   CBG (last 3)   Recent Labs  03/09/14 2319 03/10/14 0331 03/10/14 0806  GLUCAP 114* 124* 97   CLINICAL DATA: 33 year old male status post drainage of empyema.  Initial encounter.  EXAM:  PORTABLE CHEST - 1 VIEW  COMPARISON: 03/10/2014 and earlier.  FINDINGS:  Portable AP semi upright view at 0546 hrs. One of the 3 right chest  tubes has been removed, 2 right chest tubes remains. Stable right IJ  central line. No pneumothorax. Residual at least partially loculated   right pleural fluid appears stable. Ventilation in the right lung is  stable. Left lung ventilation is mildly improved with less opacity  at the lung base. Stable cardiac size and mediastinal contours.  Visualized tracheal air column is within normal limits. Stable  gas-filled splenic flexure.  IMPRESSION:  1. One of 3 right chest tubes has been removed, two remain. No  pneumothorax.  2. Stable right lung ventilation with residual partially loculated  pleural fluid.  3. Mildly improved left lung ventilation, decreased atelectasis.  Electronically Signed  By: Augusto GambleLee Hall M.D.  On: 03/11/2014 07:46    Assessment/Plan: S/P Procedure(s) (LRB): THORACOTOMY  (Right) DECORTICATION, DRAINAGE OF EMPYEMA (Right)  He continues to do well. Will remove anterior chest tube today. Put posterior chest tube to water seal and plan to remove tomorrow.  No pathogen isolated on cultures. Will continue vanc and zosyn for now and plan to transition to oral antibiotic at discharge. Laxative today. Transfer to 2W. IS and ambulation Will continue PCA until all chest tubes out.   LOS: 3 days    BARTLE,BRYAN K 03/11/2014

## 2014-03-11 NOTE — Progress Notes (Signed)
ANTIBIOTIC CONSULT NOTE   Pharmacy Consult for vancomycin and zosyn Indication:  R empyema  Allergies  Allergen Reactions  . Toradol [Ketorolac Tromethamine] Nausea Only  . Ultram [Tramadol] Nausea Only    Patient Measurements: Height: 6\' 1"  (185.4 cm) Weight: 185 lb 6.5 oz (84.1 kg) IBW/kg (Calculated) : 79.9   Vital Signs: Temp: 97.8 F (36.6 C) (10/01 1040) Temp src: Oral (10/01 1040) BP: 127/79 mmHg (10/01 1040) Pulse Rate: 46 (10/01 1040) Intake/Output from previous day: 09/30 0701 - 10/01 0700 In: 974 [I.V.:374; IV Piggyback:600] Out: 1580 [Urine:1500; Chest Tube:80] Intake/Output from this shift: Total I/O In: 20 [I.V.:20] Out: -   Labs:  Recent Labs  03/09/14 0508 03/10/14 0500  WBC 16.0* 7.5  HGB 11.7* 10.1*  PLT 593* 590*  CREATININE 0.94 0.90   Estimated Creatinine Clearance: 129.5 ml/min (by C-G formula based on Cr of 0.9). No results found for this basename: VANCOTROUGH, Leodis BinetVANCOPEAK, VANCORANDOM, GENTTROUGH, GENTPEAK, GENTRANDOM, TOBRATROUGH, TOBRAPEAK, TOBRARND, AMIKACINPEAK, AMIKACINTROU, AMIKACIN,  in the last 72 hours   Microbiology: Recent Results (from the past 720 hour(s))  MRSA PCR SCREENING     Status: None   Collection Time    03/08/14  9:53 AM      Result Value Ref Range Status   MRSA by PCR NEGATIVE  NEGATIVE Final   Comment:            The GeneXpert MRSA Assay (FDA     approved for NASAL specimens     only), is one component of a     comprehensive MRSA colonization     surveillance program. It is not     intended to diagnose MRSA     infection nor to guide or     monitor treatment for     MRSA infections.  BODY FLUID CULTURE     Status: None   Collection Time    03/08/14  1:58 PM      Result Value Ref Range Status   Specimen Description FLUID RIGHT PLEURAL   Final   Special Requests NONE   Final   Gram Stain     Final   Value: RARE WBC PRESENT, PREDOMINANTLY PMN     NO ORGANISMS SEEN     Performed at [facility]    Culture     Final   Value: NO GROWTH 3 DAYS     Performed at [facility]   Report Status PENDING   Incomplete  TISSUE CULTURE     Status: None   Collection Time    03/08/14  2:10 PM      Result Value Ref Range Status   Specimen Description TISSUE RIGHT PLEURAL   Final   Special Requests POF VANCOMYCIN AND ZOSYN   Final   Gram Stain     Final   Value: RARE WBC PRESENT, PREDOMINANTLY PMN     NO ORGANISMS SEEN     Performed at [facility]   Culture     Final   Value: NO GROWTH 2 DAYS     Performed at [facility]   Report Status PENDING   Incomplete  AFB CULTURE WITH SMEAR     Status: None   Collection Time    03/08/14  2:10 PM      Result Value Ref Range Status   Specimen Description TISSUE RIGHT PLEURAL   Final   Special Requests POF VANCOMYCIN AND ZOSYN   Final   Acid Fast Smear  Final   Value: NO ACID FAST BACILLI SEEN     Performed at [facility]   Culture     Final   Value: CULTURE WILL BE EXAMINED FOR 6 WEEKS BEFORE ISSUING A FINAL REPORT     Performed at [facility]   Report Status PENDING   Incomplete  ANAEROBIC CULTURE     Status: None   Collection Time    03/08/14  2:10 PM      Result Value Ref Range Status   Specimen Description TISSUE RIGHT PLEURAL   Final   Special Requests POF VANCOMYCIN AND ZOSYN   Final   Gram Stain     Final   Value: RARE WBC PRESENT, PREDOMINANTLY PMN     NO ORGANISMS SEEN     Performed at [facility]   Culture     Final   Value: NO ANAEROBES ISOLATED; CULTURE IN PROGRESS FOR 5 DAYS     Performed at [facility]   Report Status PENDING   Incomplete  FUNGUS CULTURE W SMEAR     Status: None   Collection Time    03/08/14  2:10 PM      Result Value Ref Range Status   Specimen Description TISSUE RIGHT PLEURAL   Final   Special Requests POF VANCOMYCIN AND ZOSYN   Final   Fungal Smear     Final   Value: NO YEAST OR FUNGAL ELEMENTS SEEN     Performed at [facility]    Culture     Final   Value: CULTURE IN PROGRESS FOR FOUR WEEKS     Performed at [facility]   Report Status PENDING   Incomplete  CULTURE, ROUTINE-ABSCESS     Status: None   Collection Time    03/08/14  2:14 PM      Result Value Ref Range Status   Specimen Description ABSCESS RIGHT LUNG   Final   Special Requests POF VANCOMYCIN AND ZOSYN   Final   Gram Stain     Final   Value: RARE WBC PRESENT,BOTH PMN AND MONONUCLEAR     NO SQUAMOUS EPITHELIAL CELLS SEEN     NO ORGANISMS SEEN     Performed at [facility]   Culture     Final   Value: NO GROWTH 3 DAYS     Performed at [facility]   Report Status PENDING   Incomplete  ANAEROBIC CULTURE     Status: None   Collection Time    03/08/14  2:14 PM      Result Value Ref Range Status   Specimen Description ABSCESS RIGHT LUNG   Final   Special Requests POF VANCOMYCIN AND ZOSYN   Final   Gram Stain     Final   Value: RARE WBC PRESENT,BOTH PMN AND MONONUCLEAR     NO SQUAMOUS EPITHELIAL CELLS SEEN     NO ORGANISMS SEEN     Performed at [facility]   Culture     Final   Value: NO ANAEROBES ISOLATED; CULTURE IN PROGRESS FOR 5 DAYS     Performed at [facility]   Report Status PENDING   Incomplete    Medical History: Past Medical History  Diagnosis Date  . Anxiety   . 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 He was diagnosed with pneumonia and treated with amoxicillin and azithromycin. He says he took all of the antibiotic but did not get better and went back to [facility] ER last pm with worsening shortness of breath, cough, fever, right chest pain. Chest CT showed a large right empyema.  Transferred from [place] to [facility]. Pharmacy consulted to dose  vancomycin and zosyn for R empyema.  Wt 84 kg, labs not yet drawn. On amoxicillin and azithromycin at home.  ? abx at [facility]?  Goal of Therapy:  Vancomycin trough level 15-20 mcg/ml  Plan:  - Zosyn 3.375 gm IV q8h, infuse each dose over 4 hours - Vancomycin 1000 mg IV q8h - F/u renal fxn, wbc, temp, culture data - Noted plans to convert to oral antibiotics soon, so no vancomycin trough needed for now  Tad Moore, BCPS  Clinical Pharmacist Pager [phone]  03/11/2014 1:45 PM

## 2014-03-11 NOTE — Progress Notes (Signed)
New transfer pt, PCA syringe changed, wasted 3cc with Waylan RocherJaime Yates. Louie BunWilson,Micki Cassel S 12:16 PM

## 2014-03-12 ENCOUNTER — Inpatient Hospital Stay (HOSPITAL_COMMUNITY): Payer: No Typology Code available for payment source

## 2014-03-12 LAB — BODY FLUID CULTURE: CULTURE: NO GROWTH

## 2014-03-12 LAB — TISSUE CULTURE: CULTURE: NO GROWTH

## 2014-03-12 LAB — CULTURE, ROUTINE-ABSCESS: Culture: NO GROWTH

## 2014-03-12 MED ORDER — PIPERACILLIN-TAZOBACTAM 3.375 G IVPB
3.3750 g | Freq: Three times a day (TID) | INTRAVENOUS | Status: DC
  Administered 2014-03-12 – 2014-03-13 (×2): 3.375 g via INTRAVENOUS
  Filled 2014-03-12 (×4): qty 50

## 2014-03-12 NOTE — Discharge Summary (Signed)
[address]       [place] [id number]             [phone]              Discharge Summary  Name: Stanley CooperScottie L Williams DOB: 10/08/1980 33 y.o. MRN: [id number]   Admission Date: 03/08/2014 Discharge Date: 03/13/2014    Admitting Diagnosis: Right empyema  Discharge Diagnosis:  Right empyema  Past Medical History  Diagnosis Date  . Anxiety   . 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 PNA (pneumonia) 03/08/2014     Procedures: RIGHT THORACOTOMY -  03/08/2014  DECORTICATION  DRAINAGE OF EMPYEMA    HPI:  The patient is a 33 y.o. male with a long history of IV drug use on daily Methadone, smoking until recently, who presented to [facility] ER about a week ago with shortness of breath and cough. He was diagnosed with pneumonia and treated with Amoxicillin and another antibiotic. He says he took all of the antibiotic but did not get better and went back [facility] last pm with worsening shortness of breath, cough, fever, right chest pain. Chest CT showed a large right empyema. Dr. Laneta SimmersBartle was called by the [facility] physician and planned to transfer the patient at that time but there were no ICU or stepdown beds available.  He was ultimately transferred and admitted to [facility] on 03/08/2014.    Hospital Course:  After review of his films, Dr. Laneta SimmersBartle felt that the patient would require operative drainage of his complex empyema.  All risks, benefits and alternatives of surgery were explained in detail, and the patient agreed to proceed. The patient was taken to the operating room and underwent the above procedure.    The postoperative course has generally been uneventful.  He was continued on Vancomycin and Zosyn.  Intraoperative cultures have been negative so far and we plan to transition to oral Augmentin at discharge.  Chest tubes have been removed in the standard fashion and follow up chest x-rays have remained stable.  Incisions are  healing well.  The patient is ambulating in the hall without difficulty and tolerating a regular diet.  He has been evaluated on today's date and is presently medically stable for discharge home.    Recent vital signs:  Filed Vitals:   03/13/14 0544  BP: 120/69  Pulse: 59  Temp: 98 F (36.7 C)  Resp: 18    Recent laboratory studies:  CBC:No results found for this basename: WBC, HGB, HCT, PLT,  in the last 72 hours BMET: No results found for this basename: NA, K, CL, CO2, GLUCOSE, BUN, CREATININE, CALCIUM,  in the last 72 hours  PT/INR: No results found for this basename: LABPROT, INR,  in the last 72 hours   Discharge Medications:     Medication List         amoxicillin-clavulanate 875-125 MG per tablet  Commonly known as:  AUGMENTIN  Take 1 tablet by mouth every 12 (twelve) hours.     methadone 10 MG/ML solution  Commonly known as:  DOLOPHINE  Take 18 mLs (180 mg total) by mouth daily.     Oxycodone HCl 10 MG Tabs  Take 1 tablet (10 mg total) by mouth every 6 (six) hours as needed for moderate pain or severe pain.         Discharge Instructions:  The patient is to refrain from driving, heavy lifting  or strenuous activity.  May shower daily and clean incisions with soap and water.  May resume regular diet.   Follow Up:    Follow-up Information   Follow up with Alleen Borne, MD In 3 weeks. (Office will contact you with an appointment)    Specialty:  Cardiothoracic Surgery   Contact information:   [address] [place] [address] [phone]       Follow up with [facility]UR[facility] In 1 week. (For suture removal - office will contact you with an appointment)        Cornelia Walraven H 03/17/2014, 9:30 AM

## 2014-03-12 NOTE — Progress Notes (Signed)
Removed CT per MD order per hospital policy. Patient tolerated well. Applied Vaseline guaze, guaze and hyperfix tape to site. Patient reminded to remain in bed for at least an hour. Will continue to monitor closely. Lajuana Matteina Nusaybah Ivie, RN

## 2014-03-12 NOTE — Progress Notes (Addendum)
[address]       [place] [address]             [phone]      4 Days Post-Op Procedure(s) (LRB): THORACOTOMY  (Right) DECORTICATION, DRAINAGE OF EMPYEMA (Right) Subjective: Feels better, no recen tdrainage recorded  Objective: Vital signs in last 24 hours: Temp:  [97.2 F (36.2 C)-98.4 F (36.9 C)] 98.4 F (36.9 C) (10/02 0504) Pulse Rate:  [46-84] 70 (10/02 0504) Cardiac Rhythm:  [-] Normal sinus rhythm (10/01 2000) Resp:  [10-21] 17 (10/02 0504) BP: (111-150)/(72-94) 111/72 mmHg (10/02 0504) SpO2:  [91 %-100 %] 97 % (10/02 0504)  Hemodynamic parameters for last 24 hours:    Intake/Output from previous day: 10/01 0701 - 10/02 0700 In: 380 [P.O.:360; I.V.:20] Out: 2100 [Urine:2100] Intake/Output this shift:    General appearance: alert, cooperative and no distress Heart: regular rate and rhythm Lungs: dim right base  Abdomen: benign Wound: incis healing well  Lab Results:  Recent Labs  03/10/14 0500  WBC 7.5  HGB 10.1*  HCT 31.8*  PLT 590*   BMET:  Recent Labs  03/10/14 0500  NA 140  K 4.0  CL 99  CO2 30  GLUCOSE 135*  BUN 7  CREATININE 0.90  CALCIUM 8.3*    PT/INR: No results found for this basename: LABPROT, INR,  in the last 72 hours ABG    Component Value Date/Time   PHART 7.384 03/09/2014 0500   HCO3 25.5* 03/09/2014 0500   TCO2 26.8 03/09/2014 0500   O2SAT 94.4 03/09/2014 0500   CBG (last 3)   Recent Labs  03/09/14 2319 03/10/14 0331 03/10/14 0806  GLUCAP 114* 124* 97    Meds Scheduled Meds: . acetaminophen  1,000 mg Oral Q6H   Or  . acetaminophen (TYLENOL) oral liquid 160 mg/5 mL  1,000 mg Oral Q6H  . bisacodyl  10 mg Oral Daily  . enoxaparin (LOVENOX) injection  40 mg Subcutaneous Daily  . HYDROmorphone PCA 0.3 mg/mL   Intravenous 6 times per day  . levalbuterol  0.63 mg Nebulization BID  . methadone  180 mg Oral Q24H  . pantoprazole  40 mg Oral Daily  . piperacillin-tazobactam (ZOSYN)  IV   3.375 g Intravenous 3 times per day  . senna-docusate  1 tablet Oral QHS  . sodium chloride  10-40 mL Intracatheter Q12H  . vancomycin  1,000 mg Intravenous Q12H   Continuous Infusions: . dextrose 5 % and 0.9% NaCl 50 mL/hr at 03/10/14 0831   PRN Meds:.diphenhydrAMINE, diphenhydrAMINE, Influenza vac split quadrivalent PF, ketorolac, levalbuterol, naloxone, ondansetron (ZOFRAN) IV, oxyCODONE, pneumococcal 23 valent vaccine, potassium chloride, sodium chloride, sodium chloride Results for orders placed during the hospital encounter of 03/08/14  MRSA PCR SCREENING     Status: None   Collection Time    03/08/14  9:53 AM      Result Value Ref Range Status   MRSA by PCR NEGATIVE  NEGATIVE Final   Comment:            The GeneXpert MRSA Assay (FDA     approved for NASAL specimens     only), is one component of a     comprehensive MRSA colonization     surveillance program. It is not     intended to diagnose MRSA     infection nor to guide or     monitor treatment for     MRSA infections.  BODY FLUID CULTURE  Status: None   Collection Time    03/08/14  1:58 PM      Result Value Ref Range Status   Specimen Description FLUID RIGHT PLEURAL   Final   Special Requests NONE   Final   Gram Stain     Final   Value: RARE WBC PRESENT, PREDOMINANTLY PMN     NO ORGANISMS SEEN     Performed at [facility]   Culture     Final   Value: NO GROWTH 3 DAYS     Performed at [facility]   Report Status PENDING   Incomplete  TISSUE CULTURE     Status: None   Collection Time    03/08/14  2:10 PM      Result Value Ref Range Status   Specimen Description TISSUE RIGHT PLEURAL   Final   Special Requests POF VANCOMYCIN AND ZOSYN   Final   Gram Stain     Final   Value: RARE WBC PRESENT, PREDOMINANTLY PMN     NO ORGANISMS SEEN     Performed at [facility]   Culture     Final   Value: NO GROWTH 3 DAYS     Performed at [facility]   Report Status 03/12/2014 FINAL    Final  AFB CULTURE WITH SMEAR     Status: None   Collection Time    03/08/14  2:10 PM      Result Value Ref Range Status   Specimen Description TISSUE RIGHT PLEURAL   Final   Special Requests POF VANCOMYCIN AND ZOSYN   Final   Acid Fast Smear     Final   Value: NO ACID FAST BACILLI SEEN     Performed at [facility]   Culture     Final   Value: CULTURE WILL BE EXAMINED FOR 6 WEEKS BEFORE ISSUING A FINAL REPORT     Performed at [facility]   Report Status PENDING   Incomplete  ANAEROBIC CULTURE     Status: None   Collection Time    03/08/14  2:10 PM      Result Value Ref Range Status   Specimen Description TISSUE RIGHT PLEURAL   Final   Special Requests POF VANCOMYCIN AND ZOSYN   Final   Gram Stain     Final   Value: RARE WBC PRESENT, PREDOMINANTLY PMN     NO ORGANISMS SEEN     Performed at [facility]   Culture     Final   Value: NO ANAEROBES ISOLATED; CULTURE IN PROGRESS FOR 5 DAYS     Performed at [facility]   Report Status PENDING   Incomplete  FUNGUS CULTURE W SMEAR     Status: None   Collection Time    03/08/14  2:10 PM      Result Value Ref Range Status   Specimen Description TISSUE RIGHT PLEURAL   Final   Special Requests POF VANCOMYCIN AND ZOSYN   Final   Fungal Smear     Final   Value: NO YEAST OR FUNGAL ELEMENTS SEEN     Performed at [facility]   Culture     Final   Value: CULTURE IN PROGRESS FOR FOUR WEEKS     Performed at [facility]   Report Status PENDING   Incomplete  CULTURE, ROUTINE-ABSCESS     Status: None   Collection Time    03/08/14  2:14 PM  Result Value Ref Range Status   Specimen Description ABSCESS RIGHT LUNG   Final   Special Requests POF VANCOMYCIN AND ZOSYN   Final   Gram Stain     Final   Value: RARE WBC PRESENT,BOTH PMN AND MONONUCLEAR     NO SQUAMOUS EPITHELIAL CELLS SEEN     NO ORGANISMS SEEN     Performed at [facility]   Culture     Final   Value: NO GROWTH 3 DAYS      Performed at [facility]   Report Status 03/12/2014 FINAL   Final  ANAEROBIC CULTURE     Status: None   Collection Time    03/08/14  2:14 PM      Result Value Ref Range Status   Specimen Description ABSCESS RIGHT LUNG   Final   Special Requests POF VANCOMYCIN AND ZOSYN   Final   Gram Stain     Final   Value: RARE WBC PRESENT,BOTH PMN AND MONONUCLEAR     NO SQUAMOUS EPITHELIAL CELLS SEEN     NO ORGANISMS SEEN     Performed at [facility]   Culture     Final   Value: NO ANAEROBES ISOLATED; CULTURE IN PROGRESS FOR 5 DAYS     Performed at [facility]   Report Status PENDING   Incomplete    Xrays Dg Chest Port 1 View  03/11/2014   CLINICAL DATA:  33 year old male status post drainage of empyema. Initial encounter.  EXAM: PORTABLE CHEST - 1 VIEW  COMPARISON:  03/10/2014 and earlier.  FINDINGS: Portable AP semi upright view at 0546 hrs. One of the 3 right chest tubes has been removed, 2 right chest tubes remains. Stable right IJ central line. No pneumothorax. Residual at least partially loculated right pleural fluid appears stable. Ventilation in the right lung is stable. Left lung ventilation is mildly improved with less opacity at the lung base. Stable cardiac size and mediastinal contours. Visualized tracheal air column is within normal limits. Stable gas-filled splenic flexure.  IMPRESSION: 1. One of 3 right chest tubes has been removed, two remain. No pneumothorax. 2. Stable right lung ventilation with residual partially loculated pleural fluid. 3. Mildly improved left lung ventilation, decreased atelectasis.   Electronically Signed   By: Augusto GambleLee  Hall M.D.   On: 03/11/2014 07:46   Chest tube: no air leak   Assessment/Plan: S/P Procedure(s) (LRB): THORACOTOMY  (Right) DECORTICATION, DRAINAGE OF EMPYEMA (Right)  1 poss d/c chest tube, minimal drainage 2 cultures neg so far, cont  Vanc/zosyn then transition to po    LOS: 4 days    GOLD,WAYNE  E 03/12/2014   Chart reviewed, patient examined, agree with above. CXR looks ok. Will remove chest tube. Plan home tomorrow on Augmentin if pain controlled and ambulating well.

## 2014-03-13 ENCOUNTER — Inpatient Hospital Stay (HOSPITAL_COMMUNITY): Payer: No Typology Code available for payment source

## 2014-03-13 LAB — ANAEROBIC CULTURE

## 2014-03-13 MED ORDER — INFLUENZA VAC SPLIT QUAD 0.5 ML IM SUSY
0.5000 mL | PREFILLED_SYRINGE | Freq: Once | INTRAMUSCULAR | Status: AC
  Administered 2014-03-13: 0.5 mL via INTRAMUSCULAR
  Filled 2014-03-13: qty 0.5

## 2014-03-13 MED ORDER — OXYCODONE HCL 10 MG PO TABS: 10.0000 mg | ORAL_TABLET | Freq: Four times a day (QID) | ORAL | Status: DC | PRN

## 2014-03-13 MED ORDER — METHADONE HCL 10 MG/ML PO CONC: 180.0000 mg | Freq: Every day | ORAL | Status: AC

## 2014-03-13 MED ORDER — PNEUMOCOCCAL VAC POLYVALENT 25 MCG/0.5ML IJ INJ: 0.5000 mL | INJECTION | Freq: Once | INTRAMUSCULAR | Status: DC

## 2014-03-13 MED ORDER — PNEUMOCOCCAL VAC POLYVALENT 25 MCG/0.5ML IJ INJ: 0.5000 mL | INJECTION | INTRAMUSCULAR | Status: DC

## 2014-03-13 MED ORDER — AMOXICILLIN-POT CLAVULANATE 875-125 MG PO TABS: 1.0000 | ORAL_TABLET | Freq: Two times a day (BID) | ORAL | Status: DC

## 2014-03-13 MED ORDER — AMOXICILLIN-POT CLAVULANATE 875-125 MG PO TABS
1.0000 | ORAL_TABLET | Freq: Two times a day (BID) | ORAL | Status: DC
  Administered 2014-03-13: 1 via ORAL
  Filled 2014-03-13 (×2): qty 1

## 2014-03-13 NOTE — Progress Notes (Signed)
Removed central line to the rt neck per MD order per hospital policy. Applied pressure to site for 5 minutes and pressure dressing. Reminded patient to remain in bed for 30 minutes and not to remove dressing for 24 hours. Reviewed DC summary, meds and follow-up appts. Lajuana Matteina Trellis Vanoverbeke, RN

## 2014-03-13 NOTE — Discharge Instructions (Signed)
Thoracotomy, Care After ° These instructions give you information on caring for yourself after your procedure. Your doctor may also give you more specific instructions. Call your doctor if you have any problems or questions after your procedure. °HOME CARE °· Only take medicine as told by your doctor. Take pain medicine before your pain gets very bad. °· Take deep breaths to protect yourself from a lung infection (pneumonia). °· Cough often to clear thick spit (mucus) and to open your lungs. Hold a pillow against your chest if it hurts to cough. °· Use your breathing machine (incentive spirometer) as told. °· Change bandages (dressings) as told by your doctor. °· Take off the bandage over your chest tube area as told by your doctor. °· Continue your normal diet as told by your doctor. °· Eat high-fiber foods. This includes whole grain cereals, brown rice, beans, and fresh fruits and vegetables. °· Drink enough fluids to keep your pee (urine) clear or pale yellow. Avoid caffeine. °· Talk to your doctor about taking a medicine to soften your poop (stool softener, laxative). °· Keep doing activities as told by your doctor. Balance your activity with rest. °· Avoid lifting until your doctor says it is okay. °· Do not drive until told by your doctor. Do not drive while taking pain medicines (narcotics). °· Do not bathe, swim, or use a hot tub until told by your doctor. You may shower. Gently wash the area of your cut (incision) with soap and water as told. °· Do not use any tobacco products including cigarettes, chewing tobacco, and electronic cigarettes. Avoid being around others who smoke. °· Schedule a doctor visit to get your stitches or staples removed as told. °· Schedule and go to all doctor visits as told. °· Go to therapy that can help improve your lungs (pulmonary rehabilitation) as told by your doctor. °· Do not travel by airplane for 2 weeks after the chest tube is taken out. °GET HELP IF: °· You are bleeding  from your wounds. °· You have redness, puffiness (swelling), or more pain in the wounds. °· You feel your heart beating fast or skipping beats (irregular heartbeat). °· You have yellowish-white fluid (pus) coming from your wounds. °· You have a bad smell coming from the wound or bandage. °· You have a fever or chills. °· You feel sick to your stomach or you throw up (vomit). °· You have muscle aches. °GET HELP RIGHT AWAY IF: °· You have a rash. °· You have trouble breathing. °· Your medicines are causing you problems. °· You keep feeling sick to your stomach (nauseous). °· You feel light-headed. °· You have shortness of breath or chest pain. °· You have pain that will not go away. °MAKE SURE YOU: °· Understand these instructions. °· Will watch your condition. °· Will get help right away if you are not doing well or get worse. °Document Released: 11/27/2011 Document Revised: 10/12/2013 Document Reviewed: 01/14/2013 °[employer]® Patient Information ©2015 [employer]. This information is not intended to replace advice given to you by your health care provider. Make sure you discuss any questions you have with your health care provider. ° °

## 2014-03-13 NOTE — Progress Notes (Signed)
[address]       [place] [address]             [phone]      5 Days Post-Op Procedure(s) (LRB): THORACOTOMY  (Right) DECORTICATION, DRAINAGE OF EMPYEMA (Right) Subjective: Feels ok  Objective: Vital signs in last 24 hours: Temp:  [97.9 F (36.6 C)-98.2 F (36.8 C)] 98 F (36.7 C) (10/03 0544) Pulse Rate:  [59-70] 59 (10/03 0544) Cardiac Rhythm:  [-]  Resp:  [18] 18 (10/03 0544) BP: (100-120)/(68-72) 120/69 mmHg (10/03 0544) SpO2:  [95 %-98 %] 96 % (10/03 0835)  Hemodynamic parameters for last 24 hours:    Intake/Output from previous day: 10/02 0701 - 10/03 0700 In: 1330 [P.O.:1080; IV Piggyback:250] Out: 2875 [Urine:2875] Intake/Output this shift:    General appearance: alert, cooperative and no distress Heart: regular rate and rhythm Lungs: dim in right ase Abdomen: benign Extremities: no edema Wound: incis healing well  Lab Results: No results found for this basename: WBC, HGB, HCT, PLT,  in the last 72 hours BMET: No results found for this basename: NA, K, CL, CO2, GLUCOSE, BUN, CREATININE, CALCIUM,  in the last 72 hours  PT/INR: No results found for this basename: LABPROT, INR,  in the last 72 hours ABG    Component Value Date/Time   PHART 7.384 03/09/2014 0500   HCO3 25.5* 03/09/2014 0500   TCO2 26.8 03/09/2014 0500   O2SAT 94.4 03/09/2014 0500   CBG (last 3)  No results found for this basename: GLUCAP,  in the last 72 hours  Meds Scheduled Meds: . acetaminophen  1,000 mg Oral Q6H   Or  . acetaminophen (TYLENOL) oral liquid 160 mg/5 mL  1,000 mg Oral Q6H  . bisacodyl  10 mg Oral Daily  . enoxaparin (LOVENOX) injection  40 mg Subcutaneous Daily  . methadone  180 mg Oral Q24H  . pantoprazole  40 mg Oral Daily  . piperacillin-tazobactam (ZOSYN)  IV  3.375 g Intravenous Q8H  . senna-docusate  1 tablet Oral QHS  . sodium chloride  10-40 mL Intracatheter Q12H  . vancomycin  1,000 mg Intravenous Q12H   Continuous  Infusions:  PRN Meds:.Influenza vac split quadrivalent PF, ketorolac, levalbuterol, oxyCODONE, pneumococcal 23 valent vaccine, potassium chloride, sodium chloride  Xrays Dg Chest 2 View  03/13/2014   CLINICAL DATA:  Right-sided thoracotomy 03/08/2014.  EXAM: CHEST  2 VIEW  COMPARISON:  03/12/2014  FINDINGS: Right-sided chest tube has been removed. Right jugular central venous catheter remains in place with tip overlying the lower SVC. Cardiomediastinal silhouette is unchanged. Moderate right pleural effusion does not appear significantly changed. There is no left pleural effusion. Right basilar parenchymal opacities are unchanged. Ill-defined densities in the right midlung are also similar to the prior study. Linear opacity in the left lung base persists, likely atelectasis. No pneumothorax is identified. Soft tissue emphysema is noted in the right lateral chest wall.  IMPRESSION: 1. Interval removal of right chest tube. No pneumothorax identified. 2. Moderate right pleural effusion without significant interval change. 3. Persistent right greater than left basilar atelectasis.   Electronically Signed   By: Sebastian Ache   On: 03/13/2014 09:26   Dg Chest Port 1 View  03/12/2014   CLINICAL DATA:  Chest tube.  EXAM: PORTABLE CHEST - 1 VIEW  COMPARISON:  03/11/2014.  FINDINGS: Lower right chest tube is been removed. The remaining right chest tube in stable position. Right IJ line in stable position. Mediastinal structures  normal. Bibasilar atelectasis. Heart size normal. Mild infiltrate right upper lobe. Interim slight reduction of right pleural effusion. Thoracotomy staples on the right. Subcutaneous emphysema right is improved.  IMPRESSION: 1. Interim removal of the lower right chest tube. Remaining right chest tube in stable position. No pneumothorax. Right pleural effusion has decreased in size. 2. Right IJ line in stable position. 3. Bibasilar atelectasis and mild right upper lobe infiltrate.    Electronically Signed   By: Maisie Fushomas  Register   On: 03/12/2014 08:05    Assessment/Plan: S/P Procedure(s) (LRB): THORACOTOMY  (Right) DECORTICATION, DRAINAGE OF EMPYEMA (Right)  1 stable for d/c on po augmentin today     LOS: 5 days    Lenya Sterne E 03/13/2014

## 2014-03-19 ENCOUNTER — Encounter (INDEPENDENT_AMBULATORY_CARE_PROVIDER_SITE_OTHER): Payer: Self-pay

## 2014-03-19 ENCOUNTER — Other Ambulatory Visit: Payer: Self-pay | Admitting: *Deleted

## 2014-03-19 DIAGNOSIS — J869 Pyothorax without fistula: Secondary | ICD-10-CM

## 2014-03-19 MED ORDER — IBUPROFEN 800 MG PO TABS: 800.0000 mg | ORAL_TABLET | Freq: Three times a day (TID) | ORAL | Status: DC

## 2014-03-19 MED ORDER — OXYCODONE HCL 10 MG PO TABS: 10.0000 mg | ORAL_TABLET | Freq: Four times a day (QID) | ORAL | Status: DC | PRN

## 2014-03-23 NOTE — Op Note (Signed)
03/08/2014 Stanley Williams [id number]  Surgeon: Stanley BorneBryan K. Davion Flannery, MD   First Assistant: Stanley CeoGina Collins, PA-C  Preoperative Diagnosis: right empyema   Postoperative Diagnosis: right empyema   Procedure:  1. right thoracotomy  2. Drainage of empyema  3. 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 He was diagnosed with pneumonia and treated with amoxicillin and another antibiotic. He says he took all of the antibiotic but did not get better and [facility] pm with worsening shortness of breath, cough, fever, right chest pain. Chest CT showed a large right empyema. There is a large complex right empyema that will require drainage and decortication of the lung. Plan to do that this afternoon. I discussed the procedure of right thoracotomy, drainage of empyema and decortication of the lung with the patient including alternatives, benefits, and risks including but not limited to bleeding, infection, prolonged air leak, respiratory failure, and he understands and agrees to proceed.     Preparation:  The patient was seen in the preoperative holding area and the correct patient, correct operation, correct operative sidewere confirmed with the patient after reviewing the medical record and CT scan. The consent was signed by me. Preoperative antibiotics were given. The right side of the chest was signed by me. The patient was taken back to the operating room and positioned supine on the operating room table. After being placed under general endotracheal anesthesia by the anesthesia team using a double lumen tube a foley catheter was placed. The patient was turned into the left lateral decubitus position. The chest was prepped  with betadine soap and solution. A surgical time-out was taken and the correct patient,operative side, and operative procedure were confirmed with the nursing and anesthesia staff.   Operative Procedure:  A short lateral thoracotomy incision was made and the chest entered through the 5th ICS. The pleural space was filled with a yellowish green multiloculated empyema that was sero-purulent with a thick fibrinous peel over the lung. The empyema was completely drained and the right lung decorticated. This allowed complete expansion of the lung. The chest was irrigated with warm saline. Hemostasis was complete. Three 32 F Bard drains were placed through separate stab incisions and were positioned posteriorly, inferiorly, and anteriorly in the pleural space. The ribs were reapproximated with # 2 vicryl pericostal sutures and the muscles closed with continuous 0 vicryl suture. The subcutaneous tissue was closed with 2-0 vicryl continuous suture. The skin was closed with 3-0 vicryl subcuticular suture. All sponge, needle, and instrument counts were reported correct at the end of the case. Dry sterile dressings were placed over the incisions and around the chest tubes which were connected to pleurevac suction. The patient was turned supine, extubated,then transported to the PACU in satisfactory and stable condition.

## 2014-03-30 ENCOUNTER — Telehealth: Payer: Self-pay | Admitting: *Deleted

## 2014-03-30 ENCOUNTER — Other Ambulatory Visit: Payer: Self-pay | Admitting: *Deleted

## 2014-03-30 DIAGNOSIS — G8918 Other acute postprocedural pain: Secondary | ICD-10-CM

## 2014-03-30 MED ORDER — OXYCODONE HCL 10 MG PO TABS: 10.0000 mg | ORAL_TABLET | Freq: Four times a day (QID) | ORAL | Status: DC | PRN

## 2014-03-30 NOTE — Telephone Encounter (Signed)
Mr. Stanley Williams has called with request for a pain med refill.  He is having the typical post thoracotomy pain, numbness and burning that travels around to the front of his chest and upper abdomen.  I reassured him that this was normal.  A new RX will be available at the front desk for pick up today and he agreed.

## 2014-04-05 LAB — FUNGUS CULTURE W SMEAR: Fungal Smear: NONE SEEN

## 2014-04-07 ENCOUNTER — Telehealth: Payer: Self-pay | Admitting: *Deleted

## 2014-04-07 DIAGNOSIS — G8918 Other acute postprocedural pain: Secondary | ICD-10-CM

## 2014-04-07 MED ORDER — OXYCODONE HCL 10 MG PO TABS: 10.0000 mg | ORAL_TABLET | Freq: Four times a day (QID) | ORAL | Status: DC | PRN

## 2014-04-07 NOTE — Telephone Encounter (Signed)
Mr. Stanley Williams calls for a pain med refill. I discussed continuing this with Dr. Laneta SimmersBartle, since he is on methadone chronically.  He approved it.  I informed Mr. Stanley Williams that a new RX would be at  the front desk for him and he agreed.

## 2014-04-13 ENCOUNTER — Encounter: Payer: Self-pay | Admitting: *Deleted

## 2014-04-13 ENCOUNTER — Other Ambulatory Visit: Payer: Self-pay | Admitting: Surgery

## 2014-04-13 DIAGNOSIS — J869 Pyothorax without fistula: Secondary | ICD-10-CM

## 2014-04-14 ENCOUNTER — Ambulatory Visit (INDEPENDENT_AMBULATORY_CARE_PROVIDER_SITE_OTHER): Payer: No Typology Code available for payment source | Admitting: Surgery

## 2014-04-14 ENCOUNTER — Encounter: Payer: Self-pay | Admitting: Surgery

## 2014-04-14 ENCOUNTER — Ambulatory Visit
Admission: RE | Admit: 2014-04-14 | Discharge: 2014-04-14 | Disposition: A | Discharge status: Home / Self Care | Payer: Self-pay | Source: Ambulatory Visit | Attending: Surgery | Admitting: Surgery

## 2014-04-14 VITALS — BP 109/70 | HR 66 | Ht 73.0 in | Wt 180.0 lb

## 2014-04-14 DIAGNOSIS — G8918 Other acute postprocedural pain: Secondary | ICD-10-CM

## 2014-04-14 DIAGNOSIS — J869 Pyothorax without fistula: Secondary | ICD-10-CM

## 2014-04-14 MED ORDER — OXYCODONE HCL 10 MG PO TABS
10.0000 mg | ORAL_TABLET | Freq: Four times a day (QID) | ORAL | Status: DC | PRN
Start: 2014-04-14 — End: 2014-05-04

## 2014-04-15 ENCOUNTER — Encounter: Payer: Self-pay | Admitting: Surgery

## 2014-04-15 NOTE — Progress Notes (Signed)
     HPI:  Patient returns for routine postoperative follow-up having undergone right thoracotomy for drainage of an empyema on 03/08/2014. The patient's early postoperative recovery while in the hospital was notable for an uncomplicated postop course. Since hospital discharge the patient reports that he still has some right chest wall pain but it is improving. He is taking Ibuprofen and oxycodone in addition to his chronic methadone. He denies any cough or sputum production and has no fever.   Current Outpatient Prescriptions  Medication Sig Dispense Refill  . methadone (DOLOPHINE) 10 MG/ML solution Take 18 mLs (180 mg total) by mouth daily. 18 mL 0  . ibuprofen (ADVIL,MOTRIN) 800 MG tablet Take 1 tablet (800 mg total) by mouth every 8 (eight) hours. For 72 hours, then change to every 8 hours as needed for pain 30 tablet 0  . Oxycodone HCl 10 MG TABS Take 1 tablet (10 mg total) by mouth every 6 (six) hours as needed. 40 tablet 0   No current facility-administered medications for this visit.    Physical Exam: BP 109/70 mmHg  Pulse 66  Ht 6\' 1"  (1.854 m)  Wt 180 lb (81.647 kg)  BMI 23.75 kg/m2  SpO2 94% He looks well Lungs are clear Cardiac exam shows a regular rate and rhythm. The chest incision is healing well  Diagnostic Tests:  CLINICAL DATA: Follow-up empyema.  EXAM: CHEST 2 VIEW  COMPARISON: 03/13/2014  FINDINGS: The cardiac silhouette, mediastinal and hilar contours are within normal limits and stable. The right IJ catheter has been removed. There is a small chronic right pleural effusion and probable chronic pleural thickening with some overlying atelectasis. Otherwise, the right lung shows much improved aeration in the left lung is clear. The bony thorax is intact.  IMPRESSION: Probable chronic small right pleural effusion and or pleural thickening with some overlying atelectasis.   Impression:  He is doing well overall after his surgery. I am not  surprised that he requires more pain medicine because of his prior history of drug abuse and chronic methadone use. The pain should gradually improve. I told him that we will continue his oxycodone for now but will have to taper this over the next month or so.  Plan:  I will see him back in 1 month.

## 2014-04-20 LAB — AFB CULTURE WITH SMEAR (NOT AT ARMC): Acid Fast Smear: NONE SEEN

## 2014-05-04 ENCOUNTER — Telehealth: Payer: Self-pay | Admitting: *Deleted

## 2014-05-04 ENCOUNTER — Other Ambulatory Visit: Payer: Self-pay | Admitting: *Deleted

## 2014-05-04 DIAGNOSIS — G8928 Other chronic postprocedural pain: Secondary | ICD-10-CM

## 2014-05-04 DIAGNOSIS — G8918 Other acute postprocedural pain: Secondary | ICD-10-CM

## 2014-05-04 MED ORDER — OXYCODONE HCL 10 MG PO TABS: 10.0000 mg | ORAL_TABLET | Freq: Four times a day (QID) | ORAL | Status: DC | PRN

## 2014-05-04 NOTE — Telephone Encounter (Signed)
Mr. Stanley Williams has called for a refill for his pain med. A new script will be made available for him today and he is aware.

## 2014-05-19 ENCOUNTER — Ambulatory Visit: Payer: Self-pay | Admitting: Surgery

## 2014-05-19 ENCOUNTER — Other Ambulatory Visit: Payer: Self-pay | Admitting: *Deleted

## 2014-05-20 ENCOUNTER — Other Ambulatory Visit: Payer: Self-pay | Admitting: *Deleted

## 2014-05-20 DIAGNOSIS — G8918 Other acute postprocedural pain: Secondary | ICD-10-CM

## 2014-05-20 MED ORDER — OXYCODONE HCL 10 MG PO TABS: 10.0000 mg | ORAL_TABLET | Freq: Two times a day (BID) | ORAL | Status: DC

## 2014-05-20 NOTE — Telephone Encounter (Signed)
Mr. Stanley Williams has called requesting a pain med refill. He was last seen in our office on 04/15/14.  At that time, Dr. Laneta SimmersBartle discussed with him that he needed to taper the use of his pain med in the next month or so.  He missed his 05/19/14 office visit.  I said that a new script would be made available for pickup today at our office. It will have to last until his next office visit on 06/16/14.

## 2014-06-16 ENCOUNTER — Encounter: Payer: Self-pay | Admitting: Surgery

## 2014-06-16 ENCOUNTER — Ambulatory Visit (INDEPENDENT_AMBULATORY_CARE_PROVIDER_SITE_OTHER): Payer: Self-pay | Admitting: Surgery

## 2014-06-16 VITALS — BP 156/97 | HR 66 | Resp 20 | Ht 73.0 in | Wt 180.0 lb

## 2014-06-16 DIAGNOSIS — G8918 Other acute postprocedural pain: Secondary | ICD-10-CM

## 2014-06-16 DIAGNOSIS — Z9889 Other specified postprocedural states: Secondary | ICD-10-CM

## 2014-06-16 DIAGNOSIS — J869 Pyothorax without fistula: Secondary | ICD-10-CM

## 2014-06-16 NOTE — Progress Notes (Signed)
     HPI:  He returns today for follow up after having undergone right thoracotomy for drainage of an empyema on 03/08/2014. He had a slow recovery due to prolonged chest wall pain. He is a recovering drug abuser on chronic methadone therapy. Since I last saw him on 04/15/2014 he says that he is having minimal pain. He still has some numbness in the anterior chest in the same intercostal distribution as his incisions.  Current Outpatient Prescriptions  Medication Sig Dispense Refill  . ibuprofen (ADVIL,MOTRIN) 800 MG tablet Take 1 tablet (800 mg total) by mouth every 8 (eight) hours. For 72 hours, then change to every 8 hours as needed for pain 30 tablet 0  . methadone (DOLOPHINE) 10 MG/ML solution Take 18 mLs (180 mg total) by mouth daily. 18 mL 0  . Oxycodone HCl 10 MG TABS Take 1 tablet (10 mg total) by mouth 2 (two) times daily. 30 tablet 0   No current facility-administered medications for this visit.     Physical Exam: BP 156/97 mmHg  Pulse 66  Resp 20  Ht 6\' 1"  (1.854 m)  Wt 180 lb (81.647 kg)  BMI 23.75 kg/m2  SpO2 98% He looks well Lungs are clear Cardiac exam shows a regular rate and rhythm The right chest incision is healing well    Impression:  He looks good. I reassured him that the chest wall numbness is normal and will gradually resolve over 6-12 months. He does not need any further pain meds.  Plan:  He does not require any further follow up for this problem.

## 2014-12-11 ENCOUNTER — Emergency Department (HOSPITAL_COMMUNITY): Payer: Self-pay

## 2014-12-11 ENCOUNTER — Emergency Department (HOSPITAL_COMMUNITY)
Admission: EM | Admit: 2014-12-11 | Discharge: 2014-12-11 | Disposition: A | Discharge status: Home / Self Care | Payer: Self-pay | Attending: Emergency Medicine | Admitting: Emergency Medicine

## 2014-12-11 ENCOUNTER — Encounter (HOSPITAL_COMMUNITY): Payer: Self-pay | Admitting: Emergency Medicine

## 2014-12-11 DIAGNOSIS — R079 Chest pain, unspecified: Secondary | ICD-10-CM

## 2014-12-11 DIAGNOSIS — Z79899 Other long term (current) drug therapy: Secondary | ICD-10-CM | POA: Insufficient documentation

## 2014-12-11 DIAGNOSIS — R0789 Other chest pain: Secondary | ICD-10-CM | POA: Insufficient documentation

## 2014-12-11 DIAGNOSIS — Z87891 Personal history of nicotine dependence: Secondary | ICD-10-CM | POA: Insufficient documentation

## 2014-12-11 DIAGNOSIS — Z8701 Personal history of pneumonia (recurrent): Secondary | ICD-10-CM | POA: Insufficient documentation

## 2014-12-11 DIAGNOSIS — Z8659 Personal history of other mental and behavioral disorders: Secondary | ICD-10-CM | POA: Insufficient documentation

## 2014-12-11 LAB — BASIC METABOLIC PANEL
Anion gap: 8 (ref 5–15)
BUN: 6 mg/dL (ref 6–20)
CO2: 30 mmol/L (ref 22–32)
Calcium: 9 mg/dL (ref 8.9–10.3)
Chloride: 101 mmol/L (ref 101–111)
Creatinine, Ser: 0.92 mg/dL (ref 0.61–1.24)
GFR calc Af Amer: >60 mL/min (ref >60)
GFR calc non Af Amer: >60 mL/min (ref >60)
GLUCOSE: 89 mg/dL (ref 65–99)
POTASSIUM: 3.5 mmol/L (ref 3.5–5.1)
SODIUM: 139 mmol/L (ref 135–145)

## 2014-12-11 LAB — CBC
HEMATOCRIT: 34.9 % — AB (ref 39.0–52.0)
Hemoglobin: 11.7 g/dL — ABNORMAL LOW (ref 13.0–17.0)
MCH: 29 pg (ref 26.0–34.0)
MCHC: 33.5 g/dL (ref 30.0–36.0)
MCV: 86.4 fL (ref 78.0–100.0)
Platelets: 197 10*3/uL (ref 150–400)
RBC: 4.04 MIL/uL — ABNORMAL LOW (ref 4.22–5.81)
RDW: 12.2 % (ref 11.5–15.5)
WBC: 6.3 10*3/uL (ref 4.0–10.5)

## 2014-12-11 LAB — I-STAT TROPONIN, ED: Troponin i, poc: 0 ng/mL (ref 0.00–0.08)

## 2014-12-11 MED ORDER — LORAZEPAM 0.5 MG PO TABS: 0.5000 mg | ORAL_TABLET | Freq: Three times a day (TID) | ORAL | Status: AC | PRN

## 2014-12-11 MED ORDER — MORPHINE SULFATE 4 MG/ML IJ SOLN: 4.0000 mg | Freq: Once | INTRAMUSCULAR | Status: DC

## 2014-12-11 MED ORDER — ONDANSETRON HCL 4 MG/2ML IJ SOLN
4.0000 mg | Freq: Once | INTRAMUSCULAR | Status: AC
  Administered 2014-12-11: 4 mg via INTRAVENOUS
  Filled 2014-12-11: qty 2

## 2014-12-11 MED ORDER — KETOROLAC TROMETHAMINE 30 MG/ML IJ SOLN
30.0000 mg | Freq: Once | INTRAMUSCULAR | Status: AC
  Administered 2014-12-11: 30 mg via INTRAVENOUS
  Filled 2014-12-11: qty 1

## 2014-12-11 MED ORDER — FENTANYL CITRATE (PF) 100 MCG/2ML IJ SOLN
50.0000 ug | Freq: Once | INTRAMUSCULAR | Status: AC
  Administered 2014-12-11: 50 ug via INTRAVENOUS
  Filled 2014-12-11: qty 2

## 2014-12-11 MED ORDER — IOHEXOL 350 MG/ML SOLN
75.0000 mL | Freq: Once | INTRAVENOUS | Status: AC | PRN
  Administered 2014-12-11: 75 mL via INTRAVENOUS

## 2014-12-11 MED ORDER — PREDNISONE 50 MG PO TABS: 50.0000 mg | ORAL_TABLET | Freq: Every day | ORAL | Status: AC

## 2014-12-11 MED ORDER — HYDROCODONE-ACETAMINOPHEN 5-325 MG PO TABS: 1.0000 | ORAL_TABLET | Freq: Four times a day (QID) | ORAL | Status: AC | PRN

## 2014-12-11 MED ORDER — SODIUM CHLORIDE 0.9 % IV BOLUS (SEPSIS)
1000.0000 mL | Freq: Once | INTRAVENOUS | Status: AC
  Administered 2014-12-11: 1000 mL via INTRAVENOUS

## 2014-12-11 NOTE — ED Notes (Signed)
Per EMS: pt from home for eval of right sided chest pain, pt states dx with pleurisy at [facility] on 5/28 and told to take motrin. Pt states pain has not improved. Pt with hx of right lung surgery in October or 15 due. Pt noted pain to be 8/10, burning sensation. nad noted at this time.

## 2014-12-11 NOTE — Discharge Instructions (Signed)
Return here as needed.  Follow-up with your doctor and Dr. Laneta SimmersBartle.  use ice and heat on the area that is sore

## 2014-12-11 NOTE — ED Notes (Signed)
Pt unsteady walking O2 was 93%, pt stated he felt dizzy while ambulating.

## 2014-12-11 NOTE — ED Provider Notes (Signed)
CSN: [id number]     Arrival date & time 12/11/14  1122 History   First MD Initiated Contact with Patient 12/11/14 1143     Chief Complaint  Patient presents with  . Pleurisy     (Consider location/radiation/quality/duration/timing/severity/associated sxs/prior Treatment) HPI Patient presents to the emergency department with right-sided chest pain that started several weeks ago.  The patient states that he has had problems with his chest since he had surgery for an empyema in October of last year.  The patient states that he has pain and numbness along the chest wall on the right side.  He states the area where the drains were placed is the area that most sensitive but there is a pins and needle sensation in other areas.  Patient states that seems make his condition, better or worse.  He states that he has been taking ibuprofen with some relief of his symptoms.  Patient denies nausea, vomiting, weakness, dizziness, shortness of breath, diaphoresis, back pain, fever, cough, incontinence, numbness, abdominal pain, lightheadedness or syncope.  The patient states that palpation of the area makes the pain worse.  Patient states that he is due to see his primary doctor at the middle part of the month Past Medical History  Diagnosis Date  . Anxiety   . 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 PNA (pneumonia) 03/08/2014   Past Surgical History  Procedure Laterality Date  . Ankle Left 1997  . Thoracotomy/lobectomy Right 03/08/2014    Procedure: THORACOTOMY ;  Surgeon: Alleen BorneBryan K Bartle, MD;  [facility];  Service: Thoracic;  Laterality: Right;  . Decortication Right 03/08/2014    Procedure: DECORTICATION, DRAINAGE OF EMPYEMA;  Surgeon: Alleen BorneBryan K Bartle, MD;  Location: [facility] OR;  Service: Thoracic;  Laterality: Right;   No family history on file. History  Substance Use Topics  . Smoking status: Former Smoker    Types: E-cigarettes  . Smokeless tobacco: Never Used  . Alcohol Use: No    Review of  Systems All other systems negative except as documented in the HPI. All pertinent positives and negatives as reviewed in the HPI.   Allergies  Toradol and Ultram  Home Medications   Prior to Admission medications   Medication Sig Start Date End Date Taking? Authorizing Provider  ibuprofen (ADVIL,MOTRIN) 200 MG tablet Take 600 mg by mouth every 6 (six) hours as needed for mild pain or moderate pain.   Yes Historical Provider, MD  methadone (DOLOPHINE) 10 MG/ML solution Take 18 mLs (180 mg total) by mouth daily. 03/13/14  Yes Wayne E Gold, PA-C   BP 111/67 mmHg  Pulse 63  Temp(Src) 97.7 F (36.5 C) (Oral)  Resp 11  Ht 6\' 1"  (1.854 m)  Wt 195 lb (88.451 kg)  BMI 25.73 kg/m2  SpO2 94% Physical Exam  Constitutional: He is oriented to person, place, and time. He appears well-developed and well-nourished.  HENT:  Head: Normocephalic and atraumatic.  Mouth/Throat: Oropharynx is clear and moist.  Eyes: Pupils are equal, round, and reactive to light.  Neck: Normal range of motion. Neck supple.  Cardiovascular: Normal rate, regular rhythm and normal heart sounds.  Exam reveals no gallop and no friction rub.   No murmur heard. Pulmonary/Chest: Effort normal and breath sounds normal. No respiratory distress. He has no wheezes. He exhibits tenderness.  Neurological: He is alert and oriented to person, place, and time. He exhibits normal muscle tone. Coordination normal.  Skin: Skin is warm and dry. No  rash noted. No erythema.  Psychiatric: He has a normal mood and affect. His behavior is normal.  Nursing note and vitals reviewed.   ED Course  Procedures (including critical care time) Labs Review Labs Reviewed  CBC - Abnormal; Notable for the following:    RBC 4.04 (*)    Hemoglobin 11.7 (*)    HCT 34.9 (*)    All other components within normal limits  BASIC METABOLIC PANEL  I-STAT TROPOININ, ED    Imaging Review Dg Chest 2 View  12/11/2014   CLINICAL DATA:  Right-sided chest  pain.  Diagnosed with pleurisy.  EXAM: CHEST  2 VIEW  COMPARISON:  11/07/2014  FINDINGS: Elevation of the right hemidiaphragm and mild blunting at the right costophrenic angle appears chronic. The lungs are clear. Heart and mediastinum are within normal limits. The trachea is midline. No evidence for pleural effusions. No acute bone abnormality.  IMPRESSION: No acute chest abnormality.   Electronically Signed   By: Richarda Overlie M.D.   On: 12/11/2014 12:56   Ct Angio Chest Pe W/cm &/or Wo Cm  12/11/2014   CLINICAL DATA:  Right-sided chest pain.  EXAM: CT ANGIOGRAPHY CHEST WITH CONTRAST  TECHNIQUE: Multidetector CT imaging of the chest was performed using the standard protocol during bolus administration of intravenous contrast. Multiplanar CT image reconstructions and MIPs were obtained to evaluate the vascular anatomy.  CONTRAST:  75mL OMNIPAQUE IOHEXOL 350 MG/ML SOLN  COMPARISON:  Radiograph dated 12/11/2014  FINDINGS: There are no pulmonary emboli, infiltrates, or effusions. There is slight linear atelectasis in the right middle lobe and in the lingula. There is a small intrapulmonary node along the posterior aspect of the right major fissure, not felt to be significant.  Heart size is normal. No hilar or mediastinal adenopathy. No osseous abnormality. Hepatomegaly, unchanged.  Review of the MIP images confirms the above findings.  IMPRESSION: 1. Pulmonary vascular congestion without cardiomegaly. 2. No pulmonary emboli. 3. Slight atelectasis in the right middle lobe and lingula.   Electronically Signed   By: Francene Boyers M.D.   On: 12/11/2014 15:30     EKG Interpretation None      Patient will be treated for pleuritic pain.  Told to return here as needed.  Patient's CT scan did not show any signs of pulmonary embolus or recurrence of infection.  Patient is advised to follow-up with his primary care Dr.   Charlestine Night, PA-C 12/11/14 1543  Benjiman Core, MD 12/12/14 [phone]

## 2014-12-11 NOTE — ED Notes (Signed)
Patient transported to CT 

## 2015-02-05 ENCOUNTER — Encounter (HOSPITAL_COMMUNITY): Payer: Self-pay | Admitting: Emergency Medicine

## 2015-02-05 ENCOUNTER — Emergency Department (HOSPITAL_COMMUNITY)
Admission: EM | Admit: 2015-02-05 | Discharge: 2015-02-05 | Disposition: A | Discharge status: Home / Self Care | Payer: Self-pay | Attending: Emergency Medicine | Admitting: Emergency Medicine

## 2015-02-05 DIAGNOSIS — Z79899 Other long term (current) drug therapy: Secondary | ICD-10-CM | POA: Insufficient documentation

## 2015-02-05 DIAGNOSIS — F1310 Sedative, hypnotic or anxiolytic abuse, uncomplicated: Secondary | ICD-10-CM | POA: Insufficient documentation

## 2015-02-05 DIAGNOSIS — F419 Anxiety disorder, unspecified: Secondary | ICD-10-CM | POA: Insufficient documentation

## 2015-02-05 DIAGNOSIS — F10129 Alcohol abuse with intoxication, unspecified: Secondary | ICD-10-CM | POA: Insufficient documentation

## 2015-02-05 DIAGNOSIS — F1010 Alcohol abuse, uncomplicated: Secondary | ICD-10-CM

## 2015-02-05 DIAGNOSIS — F1120 Opioid dependence, uncomplicated: Secondary | ICD-10-CM | POA: Insufficient documentation

## 2015-02-05 LAB — RAPID URINE DRUG SCREEN, HOSP PERFORMED
AMPHETAMINES: NOT DETECTED
BARBITURATES: NOT DETECTED
Benzodiazepines: POSITIVE — AB
COCAINE: NOT DETECTED
Opiates: NOT DETECTED
TETRAHYDROCANNABINOL: NOT DETECTED

## 2015-02-05 LAB — COMPREHENSIVE METABOLIC PANEL
ALT: 10 U/L — AB (ref 17–63)
AST: 15 U/L (ref 15–41)
Albumin: 3.8 g/dL (ref 3.5–5.0)
Alkaline Phosphatase: 63 U/L (ref 38–126)
Anion gap: 6 (ref 5–15)
BUN: 9 mg/dL (ref 6–20)
CHLORIDE: 103 mmol/L (ref 101–111)
CO2: 29 mmol/L (ref 22–32)
CREATININE: 0.98 mg/dL (ref 0.61–1.24)
Calcium: 9.1 mg/dL (ref 8.9–10.3)
GFR calc non Af Amer: >60 mL/min (ref >60)
Glucose, Bld: 102 mg/dL — ABNORMAL HIGH (ref 65–99)
POTASSIUM: 3.9 mmol/L (ref 3.5–5.1)
SODIUM: 138 mmol/L (ref 135–145)
Total Bilirubin: 0.6 mg/dL (ref 0.3–1.2)
Total Protein: 6.4 g/dL — ABNORMAL LOW (ref 6.5–8.1)

## 2015-02-05 LAB — CBC WITH DIFFERENTIAL/PLATELET
BASOS ABS: 0 10*3/uL (ref 0.0–0.1)
BASOS PCT: 0 % (ref 0–1)
EOS ABS: 0.3 10*3/uL (ref 0.0–0.7)
EOS PCT: 4 % (ref 0–5)
HCT: 36.3 % — ABNORMAL LOW (ref 39.0–52.0)
Hemoglobin: 12.4 g/dL — ABNORMAL LOW (ref 13.0–17.0)
Lymphocytes Relative: 38 % (ref 12–46)
Lymphs Abs: 2.7 10*3/uL (ref 0.7–4.0)
MCH: 29.5 pg (ref 26.0–34.0)
MCHC: 34.2 g/dL (ref 30.0–36.0)
MCV: 86.2 fL (ref 78.0–100.0)
Monocytes Absolute: 0.9 10*3/uL (ref 0.1–1.0)
Monocytes Relative: 13 % — ABNORMAL HIGH (ref 3–12)
Neutro Abs: 3.1 10*3/uL (ref 1.7–7.7)
Neutrophils Relative %: 45 % (ref 43–77)
PLATELETS: 271 10*3/uL (ref 150–400)
RBC: 4.21 MIL/uL — AB (ref 4.22–5.81)
RDW: 12.8 % (ref 11.5–15.5)
WBC: 7 10*3/uL (ref 4.0–10.5)

## 2015-02-05 LAB — ETHANOL: Alcohol, Ethyl (B): <5 mg/dL (ref <5)

## 2015-02-05 MED ORDER — IBUPROFEN 400 MG PO TABS: 600.0000 mg | ORAL_TABLET | Freq: Three times a day (TID) | ORAL | Status: DC | PRN

## 2015-02-05 MED ORDER — LORAZEPAM 1 MG PO TABS
1.0000 mg | ORAL_TABLET | Freq: Three times a day (TID) | ORAL | Status: DC | PRN
  Administered 2015-02-05: 1 mg via ORAL

## 2015-02-05 MED ORDER — LORAZEPAM 1 MG PO TABS
0.0000 mg | ORAL_TABLET | Freq: Four times a day (QID) | ORAL | Status: DC
  Filled 2015-02-05: qty 1

## 2015-02-05 MED ORDER — THIAMINE HCL 100 MG/ML IJ SOLN: 100.0000 mg | Freq: Every day | INTRAMUSCULAR | Status: DC

## 2015-02-05 MED ORDER — NICOTINE 21 MG/24HR TD PT24
21.0000 mg | MEDICATED_PATCH | Freq: Every day | TRANSDERMAL | Status: DC
  Administered 2015-02-05: 21 mg via TRANSDERMAL
  Filled 2015-02-05: qty 1

## 2015-02-05 MED ORDER — VITAMIN B-1 100 MG PO TABS
100.0000 mg | ORAL_TABLET | Freq: Every day | ORAL | Status: DC
  Administered 2015-02-05: 100 mg via ORAL
  Filled 2015-02-05: qty 1

## 2015-02-05 MED ORDER — ONDANSETRON HCL 4 MG PO TABS: 4.0000 mg | ORAL_TABLET | Freq: Three times a day (TID) | ORAL | Status: DC | PRN

## 2015-02-05 MED ORDER — DIPHENHYDRAMINE HCL 25 MG PO CAPS
25.0000 mg | ORAL_CAPSULE | Freq: Once | ORAL | Status: AC
  Administered 2015-02-05: 25 mg via ORAL
  Filled 2015-02-05: qty 1

## 2015-02-05 MED ORDER — LORAZEPAM 1 MG PO TABS: 0.0000 mg | ORAL_TABLET | Freq: Two times a day (BID) | ORAL | Status: DC

## 2015-02-05 NOTE — ED Notes (Signed)
Pt has returned to room - advised pt will try to reach someone at [facility] for him.

## 2015-02-05 NOTE — ED Notes (Signed)
Spoke with Shean from [facility] regarding patient's plan of care; confirmed that plan is for patient to follow up with [facility] independently and planned for discharge.

## 2015-02-05 NOTE — ED Notes (Signed)
Discharge complete.  Waiting for ride.

## 2015-02-05 NOTE — ED Notes (Signed)
Still waiting for ride.  Spoke with family member.  States we will be there in 15 minutes.  Snack provided to patient.

## 2015-02-05 NOTE — ED Notes (Signed)
Pt reports that he was a prior pt of 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  Pt reports that he wishes to detox from methadone and was told that if he needed to do so he could return to [facility] for tx.  Pt reports he last drank alcohol last night and drank 6 beers.  Pt denies SI/HI.

## 2015-02-05 NOTE — ED Notes (Signed)
Snack given and dinner tray ordered.  

## 2015-02-05 NOTE — ED Provider Notes (Signed)
CSN: [id number]     Arrival date & time 02/05/15  0807 History   First MD Initiated Contact with Patient 02/05/15 [phone]     Chief Complaint  Patient presents with  . 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 The patient presents the emergency department requesting help to detox from methadone and alcohol. The patient states that originally started on 480 mg of methadone 14 years ago and has tapered down slowly to just 140 by mouth. The patient recently lost his job and he attributes this to his job finding out he is on methadone. Patient states that he lives about an hour away and having difficulty getting to the methadone clinic and is no longer allowed to use take comes because he lost his job. The patient is prescribed Klonopin for his anxiety and does not have the money to currently purchase those prescriptions. Patient states he drinks 6-12 beers daily. Last alcohol intake was last night. This morning the patient had his normal methadone dose. He states because he is unable to get to the clinic. He frequently goes through withdrawals during the week. The patient endorses severe depression, lethargy, loss of interest in things that normally give him pleasure. He denies active suicidal ideation or passive suicidal final ideation at this time. He denies other illicit drug use. Patient denies homicidal ideation, audio or visual hallucinations.  Past Medical History  Diagnosis Date  . Anxiety   . 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 PNA (pneumonia) 03/08/2014   Past Surgical History  Procedure Laterality Date  . Ankle Left 1997  . Thoracotomy/lobectomy Right 03/08/2014    Procedure:  THORACOTOMY ;  Surgeon: Alleen Borne, MD;  Location: [facility] OR;  Service: Thoracic;  Laterality: Right;  . Decortication Right 03/08/2014    Procedure: DECORTICATION, DRAINAGE OF EMPYEMA;  Surgeon: Alleen Borne, MD;  Location: [facility] OR;  Service: Thoracic;  Laterality: Right;   No family history on file. Social History  Substance Use Topics  . Smoking status: Former Smoker    Types: E-cigarettes  . Smokeless tobacco: Never Used  . Alcohol Use: No    Review of Systems  Ten systems reviewed and are negative for acute change, except as noted in the HPI.    Allergies  Toradol and Ultram  Home Medications   Prior to Admission medications   Medication Sig Start Date End Date Taking? Authorizing Provider  ibuprofen (ADVIL,MOTRIN) 200 MG tablet Take 600 mg by mouth every 6 (six) hours as needed for mild pain or moderate pain.   Yes Historical Provider, MD  methadone (DOLOPHINE) 10 MG/ML solution Take 18 mLs (180 mg total) by mouth daily. Patient taking differently: Take 140 mg by mouth daily.  03/13/14  Yes Wayne E Gold, PA-C  HYDROcodone-acetaminophen (NORCO/VICODIN) 5-325 MG per tablet Take 1 tablet by mouth every 6 (six) hours as needed for moderate pain. Patient not taking: Reported on 02/05/2015 12/11/14   Charlestine Night, PA-C  LORazepam (ATIVAN) 0.5 MG tablet Take 1 tablet (0.5 mg total) by mouth every 8 (eight) hours as needed for anxiety. Patient not taking: Reported on 02/05/2015 12/11/14   Charlestine Night, PA-C  predniSONE (DELTASONE) 50 MG tablet Take 1 tablet (50 mg total) by mouth  daily. Patient not taking: Reported on 02/05/2015 12/11/14   Charlestine Night, PA-C   BP 122/73 mmHg  Pulse 60  Temp(Src) 97.5 F (36.4 C) (Oral)  Resp 20  Ht 6\' 1"  (1.854 m)  Wt 190 lb (86.183 kg)  BMI 25.07 kg/m2  SpO2 97% Physical Exam  Constitutional: He is oriented to person, place, and time. He appears well-developed and well-nourished. No distress.  HENT:  Head: Normocephalic and  atraumatic.  Eyes: Conjunctivae are normal. No scleral icterus.  Myotic pupils  Neck: Normal range of motion. Neck supple.  Cardiovascular: Normal rate, regular rhythm and normal heart sounds.   Pulmonary/Chest: Effort normal and breath sounds normal. No respiratory distress.  Abdominal: Soft. There is no tenderness.  Musculoskeletal: He exhibits no edema.  Neurological: He is alert and oriented to person, place, and time.  Slowed mentation due to methadone use this morning  Skin: Skin is warm and dry. He is not diaphoretic.  Psychiatric: His behavior is normal.  Nursing note and vitals reviewed.   ED Course  Procedures (including critical care time) Labs Review Labs Reviewed  CBC WITH DIFFERENTIAL/PLATELET  COMPREHENSIVE METABOLIC PANEL  URINE RAPID DRUG SCREEN, HOSP PERFORMED  ETHANOL    Imaging Review No results found. I have personally reviewed and evaluated these images and lab results as part of my medical decision-making.   EKG Interpretation None      MDM   Final diagnoses:  None    Patient here with opiate and alcohol dependence. I have spoken with one of our behavioral health counselors. Patient does not appear to have any concrete concomitant diagnoses or suicidal risk at this time.  4:46 PM Patient waiting on placement at [facility]/ [facility] I have given report to Dr. Clarene Duke.  Arthor Captain, PA-C 02/05/15 1647  Marily Memos, MD 02/05/15 1750

## 2015-02-05 NOTE — Progress Notes (Signed)
Disposition CSW completed patient referrals to [facility] and [facility] for detox placement.  CSW awaiting facility response.   [facility] Behavioral Health Disposition CSW [phone]

## 2015-02-05 NOTE — ED Notes (Signed)
Per Irving Burton, [facility], pt may call [facility] himself d/t facility had advised earlier they have beds. Pt attempting to call - no one answering - [phone].

## 2015-02-05 NOTE — ED Notes (Signed)
[facility] HAS DECLINED PT D/T ADVISED METHADONE  IS TOO HIGH OF ACUITY FOR THEIR FACILITY.

## 2015-02-05 NOTE — ED Notes (Signed)
Pt. Stated, Im here to get help from [facility] and taper down.

## 2015-02-05 NOTE — ED Notes (Signed)
Simonne Come, SW, aware UDS has resulted.

## 2015-02-05 NOTE — Discharge Instructions (Signed)
Finding Treatment for Alcohol and Drug Addiction °It can be hard to find the right place to get professional treatment. Here are some important things to consider: °· There are different types of treatment to choose from. °· Some programs are live-in (residential) while others are not (outpatient). Sometimes a combination is offered. °· No single type of program is right for everyone. °· Most treatment programs involve a combination of education, counseling, and a 12-step, spiritually-based approach. °· There are non-spiritually based programs (not 12-step). °· Some treatment programs are government sponsored. They are geared for patients without private insurance. °· Treatment programs can vary in many respects such as: °¨ Cost and types of insurance accepted. °¨ Types of on-site medical services offered. °¨ Length of stay, setting, and size. °¨ Overall philosophy of treatment.  °A person may need specialized treatment or have needs not addressed by all programs. For example, adolescents need treatment appropriate for their age. Other people have secondary disorders that must be managed as well. Secondary conditions can include mental illness, such as depression or diabetes. Often, a period of detoxification from alcohol or drugs is needed. This requires medical supervision and not all programs offer this. °THINGS TO CONSIDER WHEN SELECTING A TREATMENT PROGRAM  °· Is the program certified by the appropriate government agency? Even private programs must be certified and employ certified professionals. °· Does the program accept your insurance? If not, can a payment plan be set up? °· Is the facility clean, organized, and well run? Do they allow you to speak with graduates who can share their treatment experience with you? Can you tour the facility? Can you meet with staff? °· Does the program meet the full range of individual needs? °· Does the treatment program address sexual orientation and physical disabilities?  Do they provide age, gender, and culturally appropriate treatment services? °· Is treatment available in languages other than English? °· Is long-term aftercare support or guidance encouraged and provided? °· Is assessment of an individual's treatment plan ongoing to ensure it meets changing needs? °· Does the program use strategies to encourage reluctant patients to remain in treatment long enough to increase the likelihood of success? °· Does the program offer counseling (individual or group) and other behavioral therapies? °· Does the program offer medicine as part of the treatment regimen, if needed? °· Is there ongoing monitoring of possible relapse? Is there a defined relapse prevention program? Are services or referrals offered to family members to ensure they understand addiction and the recovery process? This would help them support the recovering individual. °· Are 12-step meetings held at the center or is transport available for patients to attend outside meetings? °In countries outside of the [place], see local directories for contact information for services in your area. °Document Released: 04/26/2005 Document Revised: 08/20/2011 Document Reviewed: 11/06/2007 °ExitCare® Patient Information ©2015 [employer]. This information is not intended to replace advice given to you by your health care provider. Make sure you discuss any questions you have with your health care provider. ° °Emergency Department Resource Guide °1) Find a Doctor and Pay Out of Pocket °Although you won't have to find out who is covered by your insurance plan, it is a good idea to ask around and get recommendations. You will then need to call the office and see if the doctor you have chosen will accept you as a new patient and what types of options they offer for patients who are self-pay. Some doctors offer discounts or will   set up payment plans for their patients who do not have insurance, but you will need to ask so you  aren't surprised when you get to your appointment. ° °2) Contact Your Local Health Department °Not all health departments have doctors that can see patients for sick visits, but many do, so it is worth a call to see if yours does. If you don't know where your local health department is, you can check in your phone book. The CDC also has a tool to help you locate your state's health department, and many state websites also have listings of all of their local health departments. ° °3) Find a Walk-in Clinic °If your illness is not likely to be very severe or complicated, you may want to try a walk in clinic. These are popping up all over the country in pharmacies, drugstores, and shopping centers. They're usually staffed by nurse practitioners or physician assistants that have been trained to treat common illnesses and complaints. They're usually fairly quick and inexpensive. However, if you have serious medical issues or chronic medical problems, these are probably not your best option. ° °No Primary Care Doctor: °- Call Health Connect at  832-8000 - they can help you locate a primary care doctor that  accepts your insurance, provides certain services, etc. °- Physician Referral Service- 1-800-533-3463 ° °Chronic Pain Problems: °Organization         Address  Phone   Notes  °[facility]  (336) 297-2271 Patients need to be referred by their primary care doctor.  ° °Medication Assistance: °Organization         Address  Phone   Notes  °[place] [facility] [address] °[place], [place] [address] (336) 641-8030 --Must be a resident of [place] °-- Must have NO insurance coverage whatsoever (no Medicaid/ Medicare, etc.) °-- The pt. MUST have a primary care doctor that directs their care regularly and follows them in the community °  °[facility]  (866) 331-1348   °[employer]  (888) 892-1162   ° °Agencies that provide inexpensive medical care: °Organization          Address  Phone   Notes  °[facility]  (336) 832-8035   °[facility]    (336) 832-7272   °[facility] [address] °[place], [place] [address] (336) 832-4777   °[facility] [address], °[place] (336) 271-4999   °[facility]    (336) 373-0678   °[facility]    (336) 272-1050   °[facility] ° [address], [place] Phone:  (336) 832-4444, Fax:  (336) 832-4440 Hours of Operation:  9 am - 6 pm, M-F.  Also accepts Medicaid/Medicare and self-pay.  °[facility] ° [address], [place] Phone: (336) 832-3150, Fax: (336) 832-3151. Hours of Operation:  8:30 am - 5:30 pm, M-F.  Also accepts Medicaid and self-pay.  °[facility] [place] [address], [place] Phone: (336) 878-6027   °[facility] [address], [place], [place] (336)723-1848, Ext. 123 Mondays & Thursdays: 7-9 AM.  First 15 patients are seen on a first come, first serve basis. °  ° °Medicaid-accepting [place] Providers: ° °Organization         Address  Phone   Notes  °[facility] [address], [place] (336) 641-2100   Also accepts self-pay patients.  °[facility] [address], [place] ° (336) 856-9996   °[facility] [address], [place] (336) 288-8857   °[facility] [address] [place] [address], [place] (336) 299-7000   °Veita Bland [address], [place]  ° (336) 373-1557 Only accepts [place] Access Medicaid patients after they have their name applied to their card.  ° °Self-Pay (no insurance) in [place]: ° °Organization         Address  Phone   Notes  °Sickle Cell Patients, [facility] [address], [place] (336) 832-1970   °[facility] [address], [place] (336) 832-4400    °[facility] [place] ° [address], [place] (336) 992-4800   °[facility]/Dr. Osei-Bonsu ° [address] [place] [address], [place] or [address], [place] (336) 841-8500 Phone number for both [place] and [place] locations is the same.  °[facility] [address], [place] (336) 299-0000   °[facility] [address] [place] [address], [place] or [address] (336) 852-7530 °(336) 878-2260   °[facility] [address], [place] (336) 350-1642, phone; (336) 294-5005, fax Sees patients 1st and 3rd Saturday of every month.  Must not qualify for public or private insurance (i.e. Medicaid, Medicare, [place] Health Choice, Veterans' Benefits) • Household income should be no more than 200% of the poverty level •The clinic cannot treat you if you are pregnant or think you are pregnant • Sexually transmitted diseases are not treated at the clinic.  ° ° °Dental Care: °Organization         Address  Phone  Notes  °[facility] [address], [place] (336) 641-6152 Accepts children up to age 21 who are enrolled in Medicaid or [place] Health Choice; pregnant women with a Medicaid card; and children who have applied for Medicaid or [place] Health Choice, but were declined, whose parents can pay a reduced fee at time of service.  °[facility] [place]  [address], [place] (336) 641-7733 Accepts children up to age 21 who are enrolled in Medicaid or [place] Health Choice; pregnant women with a Medicaid card; and children who have applied for Medicaid or [place] Health Choice, but were declined, whose parents can pay a reduced fee at time of service.  °[facility] ° [address],  (336) 641-4533 Patients are seen by appointment only. Walk-ins are not accepted. [facility] will see patients 18  years of age and older. °Monday - Tuesday (8am-5pm) °Most Wednesdays (8:30-5pm) °$30 per visit, cash only  °[facility] ° [address], [place] (336) 641-4533 Patients are seen by appointment only. Walk-ins are not accepted. [facility] will see patients 18 years of age and older. °One Wednesday Evening (Monthly: Volunteer Based).  $30 per visit, cash only  °[facility]  (919) 537-3737 for adults; Children under age 4, call Graduate Pediatric Dentistry at (919) [phone]. Children aged 4-14, please call (919) [phone] to request a pediatric application. ° Dental services are provided in all areas of dental care including fillings, crowns and bridges, complete and partial dentures, implants, gum treatment, root canals, and extractions. Preventive care is also provided. Treatment   is provided to both adults and children. °Patients are selected via a lottery and there is often a waiting list. °  °[facility] [address], °[place] ° (336) 763-8833 [email or web address] °  °[facility] [address], [place], [place] (336)723-1848, Ext. 123 Second and Fourth Thursday of each month, opens at 6:30 AM; Clinic ends at 9 AM.  Patients are seen on a first-come first-served basis, and a limited number are seen during each clinic.  ° °[facility] ° [address], [place], [place] (336) 723-7904   Eligibility Requirements °You must have lived in [place], [place], or [place] counties for at least the last three months. °  You cannot be eligible for state or federal sponsored healthcare insurance, including Veterans Administration, Medicaid, or Medicare. °  You generally cannot be eligible for healthcare insurance through your employer.  °  How to apply: °Eligibility screenings are held every Tuesday and Wednesday afternoon from 1:00 pm until 4:00 pm. You do not need an appointment for the interview!  °[facility]  [address], [place], [employer] 336-631-2330   °[facility] [facility]  336-342-8273   °[facility]  336-703-3100   °[facility]  336-570-6415   ° °Behavioral Health Resources in the Community: °Intensive Outpatient Programs °Organization         Address  Phone  Notes  °[facility] [address], [place], [place] 336-878-6098   °[facility] [address], [place], [place] 336-832-9800   °[facility] [address], [place], [place] ° 336-882-2125   °[facility] [address],  °[place], [place] 1-800-853-5163 or 336-641-4981   °Substance Abuse Resources °Organization         Address  Phone  Notes  °[facility]  336-882-2125   °[facility]  336-784-9470   °[facility]  336-285-9073   °[facility]  336-845-3988   °Residential & Outpatient Substance Abuse Program  1-800-659-3381   °Psychological Services °Organization         Address  Phone  Notes  °[facility]  336- 832-9600   °[facility]  336- 378-7881   °[facility] [address], [place] 1-800-853-5163 or 336-641-4981   ° °Mobile Crisis Teams °Organization         Address  Phone  Notes  °[facility], Mobile Crisis Care Unit  1-877-626-1772   °Assertive °[facility] ° [address] [place], [place] 336-834-9664   °Sharon DeEsch [address] °[place] 336-554-5454   ° °Self-Help/Support Groups °Organization         Address  Phone             Notes  °[facility] - variety of support groups  336- 373-1402 Call for more information  °Narcotics Anonymous (NA), [facility] [address], °[place]  2 meetings at this location  ° °Residential Treatment Programs °Organization         Address  Phone  Notes  °[facility] [address],    °[place]   1-866-801-8205   °[facility] ° [address], [place], [place] 704-293-8524   °[facility] [address], [place] 336-845-3988 Admissions: 8am-3pm M-F  °[facility] [address]    °[place],   [place] 336-841-1104   °[facility] [address], [place], [place] 336-379-7146   °[facility] [address]  °[place], [place] 336-285-9073   °[facility] Intensive Outpatient [address], [place], [place] 336-852-3033   °[facility] [address]  °[place], [place] 1-877-615-2722 or 336-784-9470   °[facility] ([facility]) [address] [place], [place] 336-227-7417 Accepts Medicaid  °[facility] [address]  ° [place] 1-800-659-3381 Substance Abuse/Addiction Treatment  ° °[place] Behavioral Health Resources °Organization         Address  Phone  Notes  °[facility]  (888) 581-9988   °Julie Brannon, PhD [address] [place], [place]   (336) 349-5553 or (336) 951-0000   °[facility]   [address] °[place], [place] (336) 349-4454   °[facility] [address], [place], [place] (336) 342-8316 Insurance/Medicaid/sponsorship through [facility]  °[facility] [address]                                    [place], [place] (336) 342-8316 Therapy/tele-psych/case  °[facility] [address]  ° [place],  (336) 349-2233    °Dr. Arfeen  (336) 349-4544   °[facility] [place]  [employer] [place] [facility] 1) [address], [place] °2) [address], [place] °3)  [address], [place] (336) 349-3220 °(336) 342-7768 ° °(336) 342-8140   °[place] [facility] (336) 342-1394 or (336) 342-3537 (After Hours)    ° ° °

## 2015-02-05 NOTE — ED Notes (Signed)
Pt changed into blue paper scrubs.  

## 2023-04-13 DIAGNOSIS — L02511 Cutaneous abscess of right hand: Secondary | ICD-10-CM | POA: Diagnosis not present

## 2023-04-13 DIAGNOSIS — S61212A Laceration without foreign body of right middle finger without damage to nail, initial encounter: Secondary | ICD-10-CM | POA: Diagnosis not present

## 2023-04-13 DIAGNOSIS — L03011 Cellulitis of right finger: Secondary | ICD-10-CM | POA: Diagnosis not present

## 2023-04-13 DIAGNOSIS — R2231 Localized swelling, mass and lump, right upper limb: Secondary | ICD-10-CM | POA: Diagnosis not present

## 2023-04-13 DIAGNOSIS — F17210 Nicotine dependence, cigarettes, uncomplicated: Secondary | ICD-10-CM | POA: Diagnosis not present

## 2023-04-13 DIAGNOSIS — Z23 Encounter for immunization: Secondary | ICD-10-CM | POA: Diagnosis not present

## 2023-04-13 DIAGNOSIS — L089 Local infection of the skin and subcutaneous tissue, unspecified: Secondary | ICD-10-CM | POA: Diagnosis not present

## 2023-04-13 DIAGNOSIS — M7989 Other specified soft tissue disorders: Secondary | ICD-10-CM | POA: Diagnosis not present

## 2023-04-13 DIAGNOSIS — M79644 Pain in right finger(s): Secondary | ICD-10-CM | POA: Diagnosis not present

## 2023-04-26 DIAGNOSIS — Z765 Malingerer [conscious simulation]: Secondary | ICD-10-CM | POA: Diagnosis not present

## 2023-04-26 DIAGNOSIS — M545 Low back pain, unspecified: Secondary | ICD-10-CM | POA: Diagnosis not present

## 2023-04-26 DIAGNOSIS — S39012A Strain of muscle, fascia and tendon of lower back, initial encounter: Secondary | ICD-10-CM | POA: Diagnosis not present

## 2023-04-26 DIAGNOSIS — W010XXA Fall on same level from slipping, tripping and stumbling without subsequent striking against object, initial encounter: Secondary | ICD-10-CM | POA: Diagnosis not present

## 2023-05-06 DIAGNOSIS — F17210 Nicotine dependence, cigarettes, uncomplicated: Secondary | ICD-10-CM | POA: Diagnosis not present

## 2023-05-06 DIAGNOSIS — M79644 Pain in right finger(s): Secondary | ICD-10-CM | POA: Diagnosis not present

## 2023-05-06 DIAGNOSIS — L03011 Cellulitis of right finger: Secondary | ICD-10-CM | POA: Diagnosis not present

## 2023-05-06 DIAGNOSIS — F1010 Alcohol abuse, uncomplicated: Secondary | ICD-10-CM | POA: Diagnosis not present

## 2023-06-03 DIAGNOSIS — R1032 Left lower quadrant pain: Secondary | ICD-10-CM | POA: Diagnosis not present

## 2023-06-03 DIAGNOSIS — R109 Unspecified abdominal pain: Secondary | ICD-10-CM | POA: Diagnosis not present

## 2023-06-03 DIAGNOSIS — R1084 Generalized abdominal pain: Secondary | ICD-10-CM | POA: Diagnosis not present

## 2023-06-04 DIAGNOSIS — R109 Unspecified abdominal pain: Secondary | ICD-10-CM | POA: Diagnosis not present

## 2023-06-26 DIAGNOSIS — Z79899 Other long term (current) drug therapy: Secondary | ICD-10-CM | POA: Diagnosis not present

## 2023-07-03 DIAGNOSIS — I10 Essential (primary) hypertension: Secondary | ICD-10-CM | POA: Diagnosis not present

## 2023-07-03 DIAGNOSIS — R000 Tachycardia, unspecified: Secondary | ICD-10-CM | POA: Diagnosis not present

## 2023-07-03 DIAGNOSIS — M47816 Spondylosis without myelopathy or radiculopathy, lumbar region: Secondary | ICD-10-CM | POA: Diagnosis not present

## 2023-07-03 DIAGNOSIS — G8929 Other chronic pain: Secondary | ICD-10-CM | POA: Diagnosis not present

## 2023-07-03 DIAGNOSIS — M545 Low back pain, unspecified: Secondary | ICD-10-CM | POA: Diagnosis not present

## 2023-07-04 DIAGNOSIS — M545 Low back pain, unspecified: Secondary | ICD-10-CM | POA: Diagnosis not present
